# Patient Record
Sex: Female | Born: 1975 | Race: Black or African American | Hispanic: No | Marital: Single | Smoking: Former smoker
Health system: Southern US, Community
[De-identification: ages and names within clinical notes are randomized; demographics above are authoritative.]

## PROBLEM LIST (undated history)

## (undated) DIAGNOSIS — Z789 Other specified health status: Secondary | ICD-10-CM

## (undated) HISTORY — PX: TUBAL LIGATION: SHX77

## (undated) HISTORY — PX: CHOLECYSTECTOMY: SHX55

---

## 2010-02-09 ENCOUNTER — Emergency Department: Payer: Self-pay | Admitting: Emergency Medicine

## 2011-09-17 ENCOUNTER — Inpatient Hospital Stay (INDEPENDENT_AMBULATORY_CARE_PROVIDER_SITE_OTHER)
Admission: RE | Admit: 2011-09-17 | Discharge: 2011-09-17 | Disposition: A | Discharge status: Home / Self Care | Payer: Medicaid Other | Source: Ambulatory Visit | Attending: Family Medicine | Admitting: Family Medicine

## 2011-09-17 DIAGNOSIS — B86 Scabies: Secondary | ICD-10-CM

## 2012-07-15 DIAGNOSIS — R079 Chest pain, unspecified: Secondary | ICD-10-CM

## 2015-03-31 ENCOUNTER — Emergency Department (HOSPITAL_COMMUNITY)
Admission: EM | Admit: 2015-03-31 | Discharge: 2015-03-31 | Disposition: A | Discharge status: Home / Self Care | Payer: Medicaid Other | Attending: Emergency Medicine | Admitting: Emergency Medicine

## 2015-03-31 ENCOUNTER — Encounter (HOSPITAL_COMMUNITY): Payer: Self-pay | Admitting: Emergency Medicine

## 2015-03-31 DIAGNOSIS — N63 Unspecified lump in unspecified breast: Secondary | ICD-10-CM

## 2015-03-31 DIAGNOSIS — Z720 Tobacco use: Secondary | ICD-10-CM | POA: Insufficient documentation

## 2015-03-31 NOTE — Discharge Instructions (Signed)
Call the breast center tomorrow to see about getting an appointment for your breast lump to be evaluated and managed. Call [facility] for an appointment for ongoing care. Return to the ER for changes or worsening symptoms.   Breast Self-Awareness Breast self-awareness allows you to notice a breast problem early while it is still small. Do a breast self-exam:  Every month, 5-7 days after your period (menstrual period).  At the same time each month if you do not have periods anymore. Look for any:  Difference between your breasts (size, shape, or position).  Change in breast shape or size.  Fluid or blood coming from your nipples.  Changes in your nipples (dimpling, nipple movement).   Change in skin color or texture (redness, scaly areas). Feel for:  Lumps.  Bumps.  Dips.  Any other changes. HOW TO DO A BREAST SELF-EXAM Look at your breasts and nipples.  Take off all your clothes above your waist.  Stand in front of a mirror in a room with good lighting.  Put your hands on your hips and push your hands downward. Feel your breasts.   Lie flat on your back or stand in the shower or tub. If you are in the shower or tub, have wet, soapy hands.  Place your right arm above your head.  Place your left hand in the right underarm area.  Make small circles using the pads (not the fingertips) of your 3 middle fingers. Press lightly and then with medium and firm pressure.  Move your fingers a little lower and make the small circles at the 3 pressures (light, medium, and firm).  Continue moving your fingers lower and making circles until you reach the bottom of your breast.  Move your fingers one finger-width towards the center of the body.  Continue making the circles, this time moving upward until you reach the bottom of your neck.  Move your fingers one finger-width towards the center of your body.  Make circles downward when starting at the bottom of the  neck. Make circles upward when starting at the bottom of the breast. Stop when you reach the middle of the chest.   Repeat these steps on the other breast. Write down what looks and feels normal for each breast. Also write down any changes you notice. GET HELP RIGHT AWAY IF:  You see any changes in your breasts or nipples.  You see skin changes.  You have unusual discharge from your nipples.  You feel a new lump.  You feel unusually thick areas. Document Released: 05/07/2008 Document Revised: 11/05/2012 Document Reviewed: 03/05/2012 [facility] Patient Information 2015 [place], [place]. This information is not intended to replace advice given to you by your health care provider. Make sure you discuss any questions you have with your health care provider.  Fibrocystic Breast Changes Fibrocystic breast changes occur when breast ducts become blocked, causing painful, fluid-filled lumps (cysts) to form in the breast. This is a common condition that is noncancerous (benign). It occurs when women go through hormonal changes during their menstrual cycle. Fibrocystic breast changes can affect one or both breasts. CAUSES  The exact cause of fibrocystic breast changes is not known, but it may be related to the female hormones estrogen and progesterone. Family traits that get passed from parent to child (genetics) may also be a factor in some cases. SIGNS AND SYMPTOMS   Tenderness, mild discomfort, or pain.   Swelling.   Ropelike feeling when touching the breast.  Lumpy breast, one or both sides.   Changes in breast size, especially before (larger) and after (smaller) the menstrual period.   Green or dark brown nipple discharge (not blood).  Symptoms are usually worse before menstrual periods start and get better toward the end of the menstrual period.  DIAGNOSIS  To make a diagnosis, your health care provider will ask you questions and perform a physical exam of your breasts. The health  care provider may recommend other tests that can examine inside your breasts, such as:  A breast X-ray (mammogram).   Ultrasonography.  An MRI.  If something more than fibrocystic breast changes is suspected, your health care provider may take a breast tissue sample (breast biopsy) to examine. TREATMENT  Often, treatment is not needed. Your health care provider may recommend over-the-counter pain relievers to help lessen pain or discomfort caused by the fibrocystic breast changes. You may also be asked to change your diet to limit or stop eating foods or drinking beverages that contain caffeine. Foods and beverages that contain caffeine include chocolate, soda, coffee, and tea. Reducing sugar and fat in your diet may also help. Your health care provider may also recommend:  Fine needle aspiration to remove fluid from a cyst that is causing pain.   Surgery to remove a large, persistent, and tender cyst. HOME CARE INSTRUCTIONS   Examine your breasts after every menstrual period. If you do not have menstrual periods, check your breasts the first day of every month. Feel for changes, such as more tenderness, a new growth, a change in breast size, or a change in a lump that has always been there.   Only take over-the-counter or prescription medicine as directed by your health care provider.   Wear a well-fitted support or sports bra, especially when exercising.   Decrease or avoid caffeine, fat, and sugar in your diet as directed by your health care provider.  SEEK MEDICAL CARE IF:   You have fluid leaking (discharge) from your nipples, especially bloody discharge.   You have new lumps or bumps in the breast.   Your breast or breasts become enlarged, red, and painful.   You have areas of your breast that pucker in.   Your nipples appear flat or indented.  Document Released: 09/05/2006 Document Revised: 11/24/2013 Document Reviewed: 05/10/2013 [facility] Patient Information  2015 [place]. This information is not intended to replace advice given to you by your health care provider. Make sure you discuss any questions you have with your health care provider.  Breast Cyst A breast cyst is a sac in the breast that is filled with fluid. Breast cysts are common in women. Women can have one or many cysts. When the breasts contain many cysts, it is usually due to a noncancerous (benign) condition called fibrocystic change. These lumps form under the influence of female hormones (estrogen and progesterone). The lumps are most often located in the upper, outer portion of the breast. They are often more swollen, painful, and tender before your period starts. They usually disappear after menopause, unless you are on hormone therapy.  There are several types of cysts:  Macrocyst. This is a cyst that is about 2 in. (5.1 cm) in diameter.   Microcyst. This is a tiny cyst that you cannot feel but can be seen with a mammogram or an ultrasound.   Galactocele. This is a cyst containing milk that may develop if you suddenly stop breastfeeding.   Sebaceous cyst of the skin. This type  of cyst is not in the breast tissue itself. Breast cysts do not increase your risk of breast cancer. However, they must be monitored closely because they can be cancerous.  CAUSES  It is not known exactly what causes a breast cyst to form. Possible causes include:  An overgrowth of milk glands and connective tissue in the breast can block the milk glands, causing them to fill with fluid.   Scar tissue in the breast from previous surgery may block the glands, causing a cyst.  RISK FACTORS Estrogen may influence the development of a breast cyst.  SIGNS AND SYMPTOMS   Feeling a smooth, round, soft lump (like a grape) in the breast that is easily moveable.   Breast discomfort or pain.  Increase in size of the lump before your menstrual period and decrease in its size after your menstrual  period.  DIAGNOSIS  A cyst can be felt during a physical exam by your health care provider. A breast X-ray exam (mammogram) and ultrasonography will be done to confirm the diagnosis. Fluid may be removed from the cyst with a needle (fine needle aspiration) to make sure the cyst is not cancerous.  TREATMENT  Treatment may not be necessary. Your health care provider may monitor the cyst to see if it goes away on its own. If treatment is needed, it may include:  Hormone treatment.   Needle aspiration. There is a chance of the cyst coming back after aspiration.   Surgery to remove the whole cyst.  HOME CARE INSTRUCTIONS   Keep all follow-up appointments with your health care provider.  See your health care provider regularly:  Get a yearly exam by your health care provider.  Have a clinical breast exam by a health care provider every 1-3 years if you are 50-78 years of age. After age 53 years, you should have the exam every year.   Get mammogram tests as directed by your health care provider.   Understand the normal appearance and feel of your breasts and perform breast self-exams.   Only take over-the-counter or prescription medicines as directed by your health care provider.   Wear a supportive bra, especially when exercising.   Avoid caffeine.   Reduce your salt intake, especially before your menstrual period. Too much salt can cause fluid retention, breast swelling, and discomfort.  SEEK MEDICAL CARE IF:   You feel, or think you feel, a lump in your breast.   You notice that both breasts look or feel different than usual.   Your breast is still causing pain after your menstrual period is over.   You need medicine for breast pain and swelling that occurs with your menstrual period.  SEEK IMMEDIATE MEDICAL CARE IF:   You have severe pain, tenderness, redness, or warmth in your breast.   You have nipple discharge or bleeding.   Your breast lump becomes  hard and painful.   You find new lumps or bumps that were not there before.   You feel lumps in your armpit (axilla).   You notice dimpling or wrinkling of the breast or nipple.   You have a fever.  MAKE SURE YOU:  Understand these instructions.  Will watch your condition.  Will get help right away if you are not doing well or get worse. Document Released: 11/19/2005 Document Revised: 07/22/2013 Document Reviewed: 06/18/2013 [facility] Patient Information 2015 [place], [place]. This information is not intended to replace advice given to you by your health care provider. Make sure you  discuss any questions you have with your health care provider.  Breast Scan Breast scan is procedure done to examine dense breast tissue, which is difficult in a normal mammogram. It is used in women with breast lesions from fibrocystic disease, fibroadenoma, and fat necrosis. It also is used to determine the course of treatment for breast cancer. LET [facility] CARE PROVIDER KNOW ABOUT:  Any allergies you have.  All medicines you are taking, including vitamins, herbs, eye drops, creams, and over-the-counter medicines.  Previous problems you or members of your family have had with the use of anesthetics.  Any blood disorders you have.  Previous surgeries you have had.  Medical conditions you have.  Pregnancy or the possibility that you may be pregnant. RISKS AND COMPLICATIONS Generally, this is a safe procedure. However, as with any procedure, complications can occur. Possible complications include:   Slight discomfort from injection of radioactive substance.  Allergic reaction to contrast or radioactive substance used in exam. BEFORE THE PROCEDURE No fasting or sedation is required. PROCEDURE   You will be asked to remove all jewelry and clothing from the waist up.  An IV tube will be inserted in your arm or hand opposite the side of the breast to be examined. If both breasts are being  evaluated, the IV tube may be inserted into a vein in the foot.  You will be positioned face down on a table. The breast to be imaged will be placed through an opening in the table.  The radioactive agent will be injected into the IV tube. You may experience a slight metallic taste after the injection.  Imaging will begin a few minutes after the injection. A scanner will be placed over the breast and will record the radiation given off.  You may also be asked to get into different positions during the scan.  When the scan is complete, the IV tube is removed. AFTER THE PROCEDURE  You will be asked to get up slowly from the scanner to avoid light-headedness from lying flat during the procedure.  Drink plenty of fluids to help flush the remaining radioactive agent from your body. Document Released: 12/14/2004 Document Revised: 11/24/2013 Document Reviewed: 07/27/2013 [facility] Patient Information 2015 [place], [place]. This information is not intended to replace advice given to you by your health care provider. Make sure you discuss any questions you have with your health care provider.

## 2015-03-31 NOTE — ED Notes (Signed)
Pt c/o lump in left breast noticed today

## 2015-03-31 NOTE — ED Provider Notes (Signed)
CSN: [id number]     Arrival date & time 03/31/15  1811 History  This chart was scribed for non-physician practitioner working with Blake DivineJohn Wofford, MD by Richarda Overlieichard Holland, ED Scribe. This patient was seen in room TR06C/TR06C and the patient's care was started at 7:02 PM.    Chief Complaint  Patient presents with  . Breast Mass   The history is provided by the patient. No language interpreter was used.   HPI Comments: Lori Mejia is a 39 y.o. female with no significant medical history who presents to the Emergency Department complaining of lump in her left upper breast that she noticed today. She denies any rash on her breast. She denies any color change or discharge from her left nipple or to the skin. Pt denies any changes to the outside portion of her left breast. Pt reports no family hx of breast CA or lumps. She states that she had a breast lump when she was pregnant with her son but reports no other episodes. No nipple inversion or breast retraction. She says that she is working on getting Medicaid currently. Pt denies any history of DM, IVDU, or CA. Pt denies use of anticoagulation medication. She denies CP, SOB, abdominal pain, nausea, vomiting, fevers, chills, numbness or weakness, or any other symptoms.   History reviewed. No pertinent past medical history. History reviewed. No pertinent past surgical history. History reviewed. No pertinent family history. History  Substance Use Topics  . Smoking status: Current Every Day Smoker  . Smokeless tobacco: Not on file  . Alcohol Use: No   OB History    No data available     Review of Systems  Constitutional: Negative for fever and chills.  Respiratory: Negative for shortness of breath.   Cardiovascular: Negative for chest pain.  Gastrointestinal: Negative for nausea, vomiting and abdominal pain.  Musculoskeletal: Negative for myalgias and arthralgias.  Skin: Negative for color change and rash.       +Breast lump  No nipple discharge   Allergic/Immunologic: Negative for immunocompromised state.  Neurological: Negative for weakness and numbness.  Hematological: Does not bruise/bleed easily.   10 Systems reviewed and all are negative for acute change except as noted in the HPI.  Allergies  Review of patient's allergies indicates no known allergies.  Home Medications   Prior to Admission medications   Not on File   BP 155/101 mmHg  Pulse 76  Temp(Src) 98.8 F (37.1 C) (Oral)  Resp 18  SpO2 100%   Physical Exam  Constitutional: She is oriented to person, place, and time. Vital signs are normal. She appears well-developed and well-nourished.  Non-toxic appearance. No distress.  Afebrile, nontoxic, NAD  HENT:  Head: Normocephalic and atraumatic.  Mouth/Throat: Mucous membranes are normal.  Eyes: Conjunctivae and EOM are normal. Right eye exhibits no discharge. Left eye exhibits no discharge.  Neck: Normal range of motion. Neck supple.  Cardiovascular: Normal rate.   Pulmonary/Chest: Effort normal. No respiratory distress. Left breast exhibits mass and tenderness. Left breast exhibits no inverted nipple, no nipple discharge and no skin change. Breasts are symmetrical.    Irregularly shaped ~4cm breast mass to superior medial quadrant of L breast, mildly TTP, without overlying skin changes, no nipple changes or discharge. Breasts symmetric. R breast without masses.  Abdominal: Normal appearance. She exhibits no distension.  Musculoskeletal: Normal range of motion.  Lymphadenopathy:    She has no axillary adenopathy.  No axillary LAD  Neurological: She is alert and oriented to person,  place, and time. She has normal strength. No sensory deficit.  Skin: Skin is warm, dry and intact. No rash noted.  No skin erythema or warmth  Psychiatric: She has a normal mood and affect. Her behavior is normal.  Nursing note and vitals reviewed.   ED Course  Procedures   DIAGNOSTIC STUDIES: Oxygen Saturation is 100% on RA,  normal by my interpretation.    COORDINATION OF CARE: 7:09 PM Discussed treatment plan with pt at bedside and pt agreed to plan.   Labs Review Labs Reviewed - No data to display  Imaging Review No results found.   EKG Interpretation None      MDM   Final diagnoses:  Breast mass in female    39 y.o. female here for breast mass, irregular borders, no nipple changes, no skin changes. Discussed that it could be benign like fibrocystic breast disease, vs breast CA. Will have her f[facility] with [facility] and ultimately the breast center for mammogram or ultrasound. I explained the diagnosis and have given explicit precautions to return to the ER including for any other new or worsening symptoms. The patient understands and accepts the medical plan as it's been dictated and I have answered their questions. Discharge instructions concerning home care and prescriptions have been given. The patient is STABLE and is discharged to home in good condition.   I personally performed the services described in this documentation, which was scribed in my presence. The recorded information has been reviewed and is accurate.  BP 155/101 mmHg  Pulse 76  Temp(Src) 98.8 F (37.1 C) (Oral)  Resp 18  SpO2 100%     Izola Teague Camprubi-Soms, PA-C 03/31/15 1925  Blake Divine, MD 04/02/15 1035

## 2016-08-06 ENCOUNTER — Ambulatory Visit (HOSPITAL_COMMUNITY)
Admission: EM | Admit: 2016-08-06 | Discharge: 2016-08-06 | Disposition: A | Discharge status: Home / Self Care | Payer: Medicaid Other | Attending: Family Medicine | Admitting: Family Medicine

## 2016-08-06 ENCOUNTER — Encounter (HOSPITAL_COMMUNITY): Payer: Self-pay | Admitting: *Deleted

## 2016-08-06 DIAGNOSIS — S39012A Strain of muscle, fascia and tendon of lower back, initial encounter: Secondary | ICD-10-CM

## 2016-08-06 MED ORDER — CYCLOBENZAPRINE HCL 5 MG PO TABS: 5.0000 mg | ORAL_TABLET | Freq: Three times a day (TID) | ORAL | 0 refills | Status: DC

## 2016-08-06 MED ORDER — KETOROLAC TROMETHAMINE 30 MG/ML IJ SOLN
30.0000 mg | Freq: Once | INTRAMUSCULAR | Status: AC
  Administered 2016-08-06: 30 mg via INTRAMUSCULAR

## 2016-08-06 MED ORDER — DICLOFENAC POTASSIUM 50 MG PO TABS: 50.0000 mg | ORAL_TABLET | Freq: Three times a day (TID) | ORAL | 0 refills | Status: DC

## 2016-08-06 MED ORDER — KETOROLAC TROMETHAMINE 30 MG/ML IJ SOLN
INTRAMUSCULAR | Status: AC
  Filled 2016-08-06: qty 1

## 2016-08-06 NOTE — ED Provider Notes (Signed)
[facility]    CSN: [id number] Arrival date & time: 08/06/16  1803  First Provider Contact:  First MD Initiated Contact with Patient 08/06/16 1913        History   Chief Complaint Chief Complaint  Patient presents with  . Back Pain    HPI Lori Mejia is a 40 y.o. female.    Back Pain  Location:  Gluteal region Quality:  Shooting Radiates to:  Does not radiate Pain severity:  Moderate Onset quality:  Sudden Duration:  12 hours Progression:  Unchanged Chronicity:  New Context: twisting   Relieved by:  None tried Worsened by:  Movement, bending, sitting, palpation and twisting Associated symptoms: no abdominal pain, no bladder incontinence, no dysuria, no fever, no leg pain, no numbness, no paresthesias and no weakness   Risk factors: lack of exercise and obesity     History reviewed. No pertinent past medical history.  There are no active problems to display for this patient.   History reviewed. No pertinent surgical history.  OB History    No data available       Home Medications    Prior to Admission medications   Not on File    Family History No family history on file.  Social History Social History  Substance Use Topics  . Smoking status: Current Every Day Smoker  . Smokeless tobacco: Not on file  . Alcohol use No     Allergies   Review of patient's allergies indicates no known allergies.   Review of Systems Review of Systems  Constitutional: Negative.  Negative for fever.  Gastrointestinal: Negative.  Negative for abdominal pain.  Genitourinary: Negative.  Negative for bladder incontinence and dysuria.  Musculoskeletal: Positive for back pain and gait problem.  Skin: Negative.   Neurological: Negative for weakness, numbness and paresthesias.  All other systems reviewed and are negative.    Physical Exam Triage Vital Signs ED Triage Vitals [08/06/16 1845]  Enc Vitals Group     BP 147/90     Pulse Rate 77     Resp  16     Temp 99.6 F (37.6 C)     Temp Source Oral     SpO2 100 %     Weight      Height      Head Circumference      Peak Flow      Pain Score      Pain Loc      Pain Edu?      Excl. in GC?    No data found.   Updated Vital Signs BP 147/90 (BP Location: Left Arm)   Pulse 77   Temp 99.6 F (37.6 C) (Oral)   Resp 16   SpO2 100%   Visual Acuity Right Eye Distance:   Left Eye Distance:   Bilateral Distance:    Right Eye Near:   Left Eye Near:    Bilateral Near:     Physical Exam  Constitutional: She is oriented to person, place, and time. She appears well-developed and well-nourished. She appears distressed.  Pulmonary/Chest: Effort normal and breath sounds normal.  Abdominal: Soft. Bowel sounds are normal.  Musculoskeletal: She exhibits tenderness.  Neurological: She is alert and oriented to person, place, and time. She has normal reflexes.  Neg slr sitting, nl ehl sens and strength.  Skin: Skin is warm and dry.  Nursing note and vitals reviewed.    UC Treatments / Results  Labs (all labs ordered  are listed, but only abnormal results are displayed) Labs Reviewed - No data to display  EKG  EKG Interpretation None       Radiology No results found.  Procedures Procedures (including critical care time)  Medications Ordered in UC Medications  ketorolac (TORADOL) 30 MG/ML injection 30 mg (not administered)     Initial Impression / Assessment and Plan / UC Course  I have reviewed the triage vital signs and the nursing notes.  Pertinent labs & imaging results that were available during my care of the patient were reviewed by me and considered in my medical decision making (see chart for details).  Clinical Course      Final Clinical Impressions(s) / UC Diagnoses   Final diagnoses:  None    New Prescriptions New Prescriptions   No medications on file     Linna HoffJames D Kalob Bergen, MD 08/06/16 1929

## 2016-08-06 NOTE — ED Triage Notes (Signed)
C/O sudden onset low back pain radiating into bilat buttocks  While crawling out of bed.  Denies parasthesias.

## 2017-08-21 ENCOUNTER — Emergency Department (HOSPITAL_COMMUNITY)
Admission: EM | Admit: 2017-08-21 | Discharge: 2017-08-21 | Disposition: A | Discharge status: Home / Self Care | Payer: Worker's Compensation | Attending: Emergency Medicine | Admitting: Emergency Medicine

## 2017-08-21 ENCOUNTER — Encounter (HOSPITAL_COMMUNITY): Payer: Self-pay | Admitting: *Deleted

## 2017-08-21 ENCOUNTER — Emergency Department (HOSPITAL_COMMUNITY): Payer: Worker's Compensation

## 2017-08-21 DIAGNOSIS — M62830 Muscle spasm of back: Secondary | ICD-10-CM | POA: Diagnosis not present

## 2017-08-21 DIAGNOSIS — S3992XA Unspecified injury of lower back, initial encounter: Secondary | ICD-10-CM | POA: Diagnosis present

## 2017-08-21 DIAGNOSIS — F17210 Nicotine dependence, cigarettes, uncomplicated: Secondary | ICD-10-CM | POA: Diagnosis not present

## 2017-08-21 DIAGNOSIS — X503XXA Overexertion from repetitive movements, initial encounter: Secondary | ICD-10-CM | POA: Insufficient documentation

## 2017-08-21 DIAGNOSIS — S39012A Strain of muscle, fascia and tendon of lower back, initial encounter: Secondary | ICD-10-CM | POA: Insufficient documentation

## 2017-08-21 DIAGNOSIS — Y9389 Activity, other specified: Secondary | ICD-10-CM | POA: Diagnosis not present

## 2017-08-21 DIAGNOSIS — Y999 Unspecified external cause status: Secondary | ICD-10-CM | POA: Diagnosis not present

## 2017-08-21 DIAGNOSIS — Y929 Unspecified place or not applicable: Secondary | ICD-10-CM | POA: Diagnosis not present

## 2017-08-21 MED ORDER — IBUPROFEN 800 MG PO TABS
800.0000 mg | ORAL_TABLET | Freq: Once | ORAL | Status: AC
  Administered 2017-08-21: 800 mg via ORAL
  Filled 2017-08-21: qty 1

## 2017-08-21 MED ORDER — IBUPROFEN 800 MG PO TABS: 800.0000 mg | ORAL_TABLET | Freq: Three times a day (TID) | ORAL | 0 refills | Status: DC

## 2017-08-21 MED ORDER — CYCLOBENZAPRINE HCL 10 MG PO TABS: 5.0000 mg | ORAL_TABLET | Freq: Two times a day (BID) | ORAL | 0 refills | Status: DC | PRN

## 2017-08-21 MED ORDER — MORPHINE SULFATE (PF) 4 MG/ML IV SOLN
4.0000 mg | Freq: Once | INTRAVENOUS | Status: AC
  Administered 2017-08-21: 4 mg via INTRAMUSCULAR
  Filled 2017-08-21: qty 1

## 2017-08-21 NOTE — ED Notes (Signed)
Pt to xray

## 2017-08-21 NOTE — ED Triage Notes (Signed)
Pt in c/o R lower back pain onset yesterday after lifting heavy boxes a work per pt, pt ambulatory, denies bowel &  Bladder incontinence, A&O x4

## 2017-08-21 NOTE — ED Provider Notes (Signed)
[facility] Provider Note   CSN: [id number] Arrival date & time: 08/21/17  1019     History   Chief Complaint Chief Complaint  Patient presents with  . Back Pain    HPI Lori Mejia is a 41 y.o. female.  HPI   Patient to the Er with PMH of back pain that happened yesterday morning when picking up a box at her [employer] job. They wanted her to see an urgent care provider but she was never seen. She has been having pain to her right hip and low lumbar that radiates down towards her knee. No weakness. The pain is shooting and she cannot find a comfortable position to sit in. She is tearful. She did not fall. She has not had any weight loss, fevers, chills bowel or urine incontinence. She did not hear a pop and has not had any numbness.  History reviewed. No pertinent past medical history.  There are no active problems to display for this patient.   Past Surgical History:  Procedure Laterality Date  . CHOLECYSTECTOMY    . TUBAL LIGATION      OB History    No data available       Home Medications    Prior to Admission medications   Medication Sig Start Date End Date Taking? Authorizing Provider  cyclobenzaprine (FLEXERIL) 10 MG tablet Take 0.5-1 tablets (5-10 mg total) by mouth 2 (two) times daily as needed. 08/21/17   Marlon Pel, PA-C  diclofenac (CATAFLAM) 50 MG tablet Take 1 tablet (50 mg total) by mouth 3 (three) times daily. 08/06/16   Linna Hoff, MD  ibuprofen (ADVIL,MOTRIN) 800 MG tablet Take 1 tablet (800 mg total) by mouth 3 (three) times daily. 08/21/17   Marlon Pel, PA-C    Family History No family history on file.  Social History Social History  Substance Use Topics  . Smoking status: Current Every Day Smoker    Packs/day: 0.50    Types: Cigarettes  . Smokeless tobacco: Never Used  . Alcohol use No     Allergies   Patient has no known allergies.   Review of Systems Review of Systems   The patient denies anorexia, fever, weight  loss, vision loss, decreased hearing, hoarseness, chest pain, syncope, dyspnea on exertion, peripheral edema, balance deficits, hemoptysis, abdominal pain, melena, hematochezia, severe indigestion/heartburn, hematuria, incontinence, genital sores, muscle weakness, suspicious skin lesions, transient blindness, depression, unusual weight change, abnormal bleeding, enlarged lymph nodes, angioedema, and breast masses.   Physical Exam Updated Vital Signs BP (!) 163/93 (BP Location: Left Arm)   Pulse 79   Temp 98.2 F (36.8 C) (Oral)   Resp 16   Ht 5' 3.5" (1.613 m)   Wt 74.8 kg (165 lb)   LMP 08/18/2017 (Approximate)   SpO2 100%   BMI 28.77 kg/m   Physical Exam  Constitutional: She appears well-developed and well-nourished. No distress.  HENT:  Head: Normocephalic and atraumatic.  Eyes: Pupils are equal, round, and reactive to light.  Neck: Normal range of motion. Neck supple.  Cardiovascular: Normal rate and regular rhythm.   Pulmonary/Chest: Effort normal.  Abdominal: Soft.  Musculoskeletal:       Right hip: She exhibits normal range of motion, normal strength, no tenderness, no bony tenderness, no swelling, no crepitus, no deformity and no laceration.       Lumbar back: She exhibits tenderness and pain. She exhibits normal range of motion, no bony tenderness, no swelling, no edema, no deformity, no laceration,  no spasm and normal pulse.  Neurological: She is alert.  Skin: Skin is warm and dry.  Nursing note and vitals reviewed.    ED Treatments / Results  Labs (all labs ordered are listed, but only abnormal results are displayed) Labs Reviewed - No data to display  EKG  EKG Interpretation None       Radiology Dg Lumbar Spine Complete  Result Date: 08/21/2017 CLINICAL DATA:  Low back injury. Right hip and leg pain after lifting heavy boxes at work. Initial encounter. EXAM: LUMBAR SPINE - COMPLETE 4+ VIEW COMPARISON:  None. FINDINGS: There is no evidence of lumbar spine  fracture. Alignment is normal. Intervertebral disc spaces are maintained. Cholecystectomy clips. 3 mm left flank stone compatible with nephrolithiasis. IMPRESSION: 1. Negative lumbar spine series. 2. 3 mm probable left renal calculus. Electronically Signed   By: Marnee Spring M.D.   On: 08/21/2017 13:53    Procedures Procedures (including critical care time)  Medications Ordered in ED Medications  morphine 4 MG/ML injection 4 mg (4 mg Intramuscular Given 08/21/17 1311)  ibuprofen (ADVIL,MOTRIN) tablet 800 mg (800 mg Oral Given 08/21/17 1311)     Initial Impression / Assessment and Plan / ED Course  I have reviewed the triage vital signs and the nursing notes.  Pertinent labs & imaging results that were available during my care of the patient were reviewed by me and considered in my medical decision making (see chart for details).     No red flags concerning patient's back pain. No s/s of central cord compression or cauda equina. Lower extremities are neurovascularly intact and patient is ambulating without difficulty. She was given a shot of Morphine and Ibuprofen for pain and she is now not having any pain. She had a lumbar xray which is unremarkable. Will refer to Dr. Eulah Pont (ortho office) for follow-up.  No red flag s/s of low back pain. Patient was counseled on back pain precautions and told to do activity as tolerated but do not lift, push, or pull heavy objects more than 10 pounds for the next week. Patient counseled to use ice or heat on back for no longer than 15 minutes every hour.   Patient prescribed muscle relaxer and counseled on proper use of muscle relaxant medication. Told not to combine with current flexeril.   Urged patient not to drink alcohol, drive, or perform any other activities that requires focus while taking either of these medications.   Patient urged to follow-up with PCP if pain does not improve with treatment and rest or if pain becomes recurrent. Urged to  return with worsening severe pain, loss of bowel or bladder control, trouble walking.   The patient verbalizes understanding and agrees with the plan.    Final Clinical Impressions(s) / ED Diagnoses   Final diagnoses:  Muscle spasm of back  Strain of lumbar region, initial encounter    New Prescriptions New Prescriptions   CYCLOBENZAPRINE (FLEXERIL) 10 MG TABLET    Take 0.5-1 tablets (5-10 mg total) by mouth 2 (two) times daily as needed.   IBUPROFEN (ADVIL,MOTRIN) 800 MG TABLET    Take 1 tablet (800 mg total) by mouth 3 (three) times daily.     Marlon Pel, PA-C 08/21/17 1412    Mancel Bale, MD 08/21/17 [phone]

## 2017-08-23 ENCOUNTER — Encounter (HOSPITAL_COMMUNITY): Payer: Self-pay

## 2017-08-23 ENCOUNTER — Emergency Department (HOSPITAL_COMMUNITY)
Admission: EM | Admit: 2017-08-23 | Discharge: 2017-08-23 | Disposition: A | Discharge status: Home / Self Care | Payer: Medicaid Other | Attending: Emergency Medicine | Admitting: Emergency Medicine

## 2017-08-23 DIAGNOSIS — Z5321 Procedure and treatment not carried out due to patient leaving prior to being seen by health care provider: Secondary | ICD-10-CM | POA: Insufficient documentation

## 2017-08-23 DIAGNOSIS — M549 Dorsalgia, unspecified: Secondary | ICD-10-CM | POA: Insufficient documentation

## 2017-08-23 NOTE — ED Notes (Signed)
Patient called 2 more times for room placement without answer.

## 2017-08-23 NOTE — ED Notes (Signed)
Patient called for room placement without answer

## 2017-08-23 NOTE — ED Triage Notes (Signed)
Patient c/o right lower back pain that radiates down the right leg x 3 days. 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 Patient states she went to [facility] 3 days ago and was given Flexeril and Ibuprofen 800 mg. States the meds help the back pain, but not the pain that radiates down the right leg.

## 2019-01-04 ENCOUNTER — Encounter (HOSPITAL_COMMUNITY): Payer: Self-pay | Admitting: Emergency Medicine

## 2019-01-04 ENCOUNTER — Ambulatory Visit (HOSPITAL_COMMUNITY)
Admission: EM | Admit: 2019-01-04 | Discharge: 2019-01-04 | Disposition: A | Discharge status: Home / Self Care | Payer: Medicaid Other | Attending: Family Medicine | Admitting: Family Medicine

## 2019-01-04 DIAGNOSIS — M545 Low back pain, unspecified: Secondary | ICD-10-CM

## 2019-01-04 DIAGNOSIS — M542 Cervicalgia: Secondary | ICD-10-CM

## 2019-01-04 MED ORDER — IBUPROFEN 800 MG PO TABS: 800.0000 mg | ORAL_TABLET | Freq: Three times a day (TID) | ORAL | 0 refills | Status: DC

## 2019-01-04 MED ORDER — CYCLOBENZAPRINE HCL 10 MG PO TABS: 5.0000 mg | ORAL_TABLET | Freq: Three times a day (TID) | ORAL | 0 refills | Status: DC | PRN

## 2019-01-04 MED ORDER — KETOROLAC TROMETHAMINE 60 MG/2ML IM SOLN
INTRAMUSCULAR | Status: AC
  Filled 2019-01-04: qty 2

## 2019-01-04 MED ORDER — KETOROLAC TROMETHAMINE 60 MG/2ML IM SOLN
60.0000 mg | Freq: Once | INTRAMUSCULAR | Status: AC
  Administered 2019-01-04: 60 mg via INTRAMUSCULAR
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
## 2019-01-04 NOTE — Discharge Instructions (Addendum)
Get plenty of rest Activity as tolerated Take ibuprofen 3 times a day with food This is for pain and inflammation Take the cyclobenzaprine as needed muscle relaxer Caution drowsiness Expect improvement over next few days Return if you fail to improve, Or if you feel worse at any time

## 2019-05-23 IMAGING — CR DG LUMBAR SPINE COMPLETE 4+V
5 series · 5 of 5 positions shown · non-contrast
Comparison: None.

CLINICAL DATA: Low back injury. Right hip and leg pain after
lifting heavy boxes at work. Initial encounter.

EXAM:
LUMBAR SPINE - COMPLETE 4+ VIEW

[l-spine obl (1 of 2)]
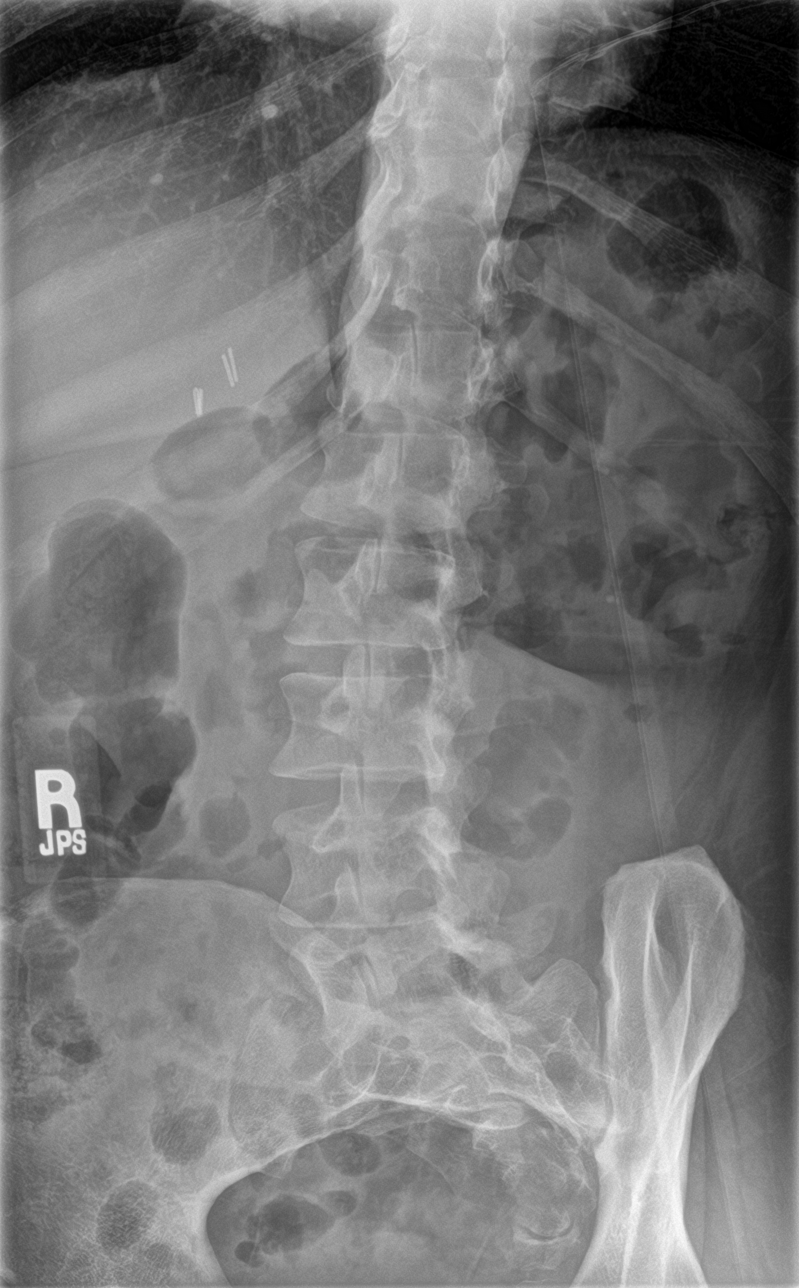

[l-spine lat]
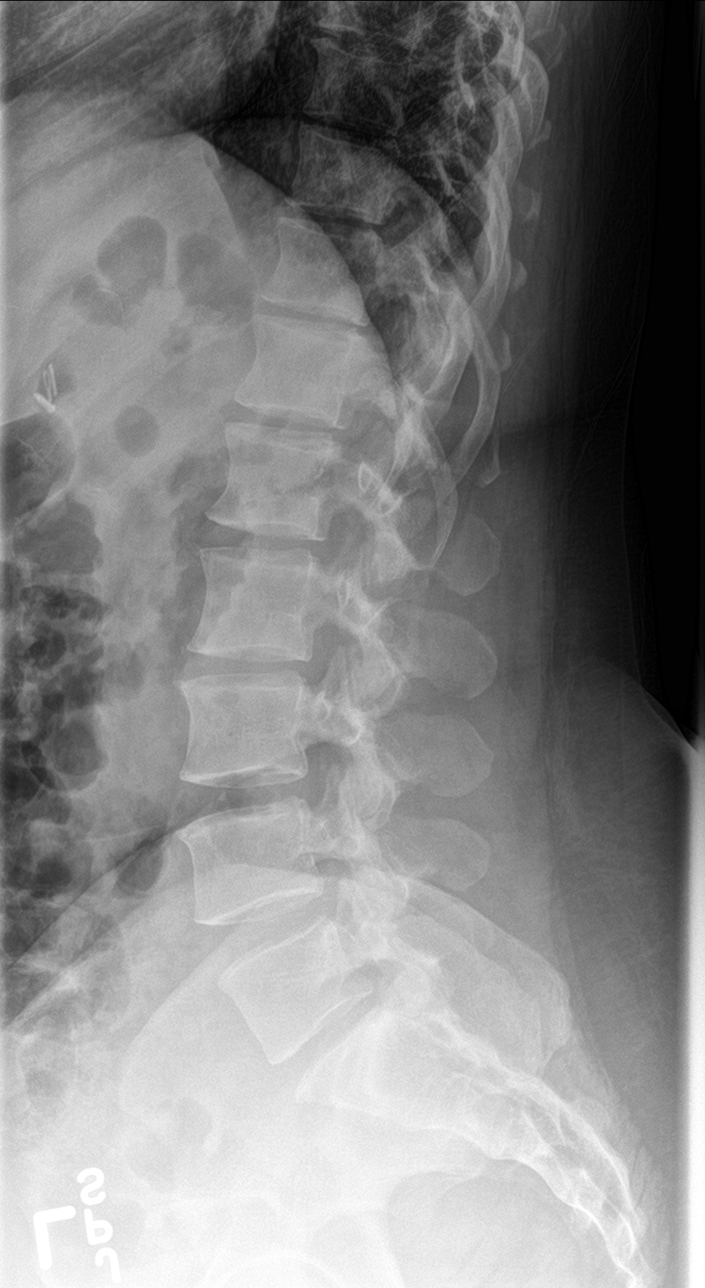

[l-spine spot]
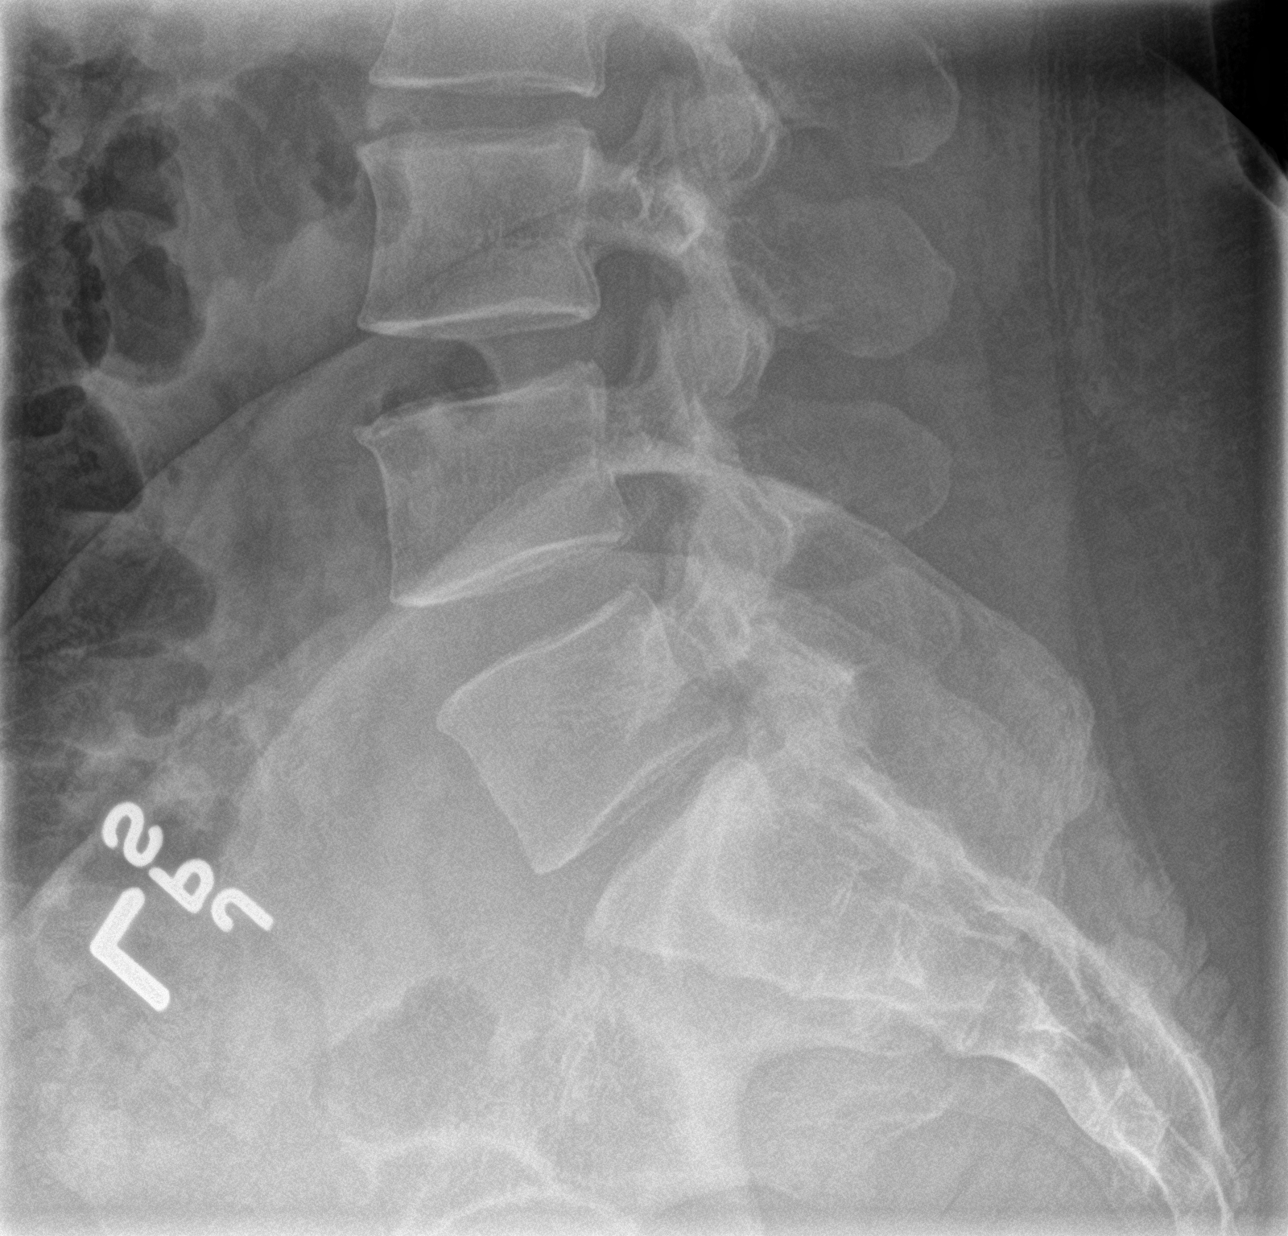

[l-spine ap]
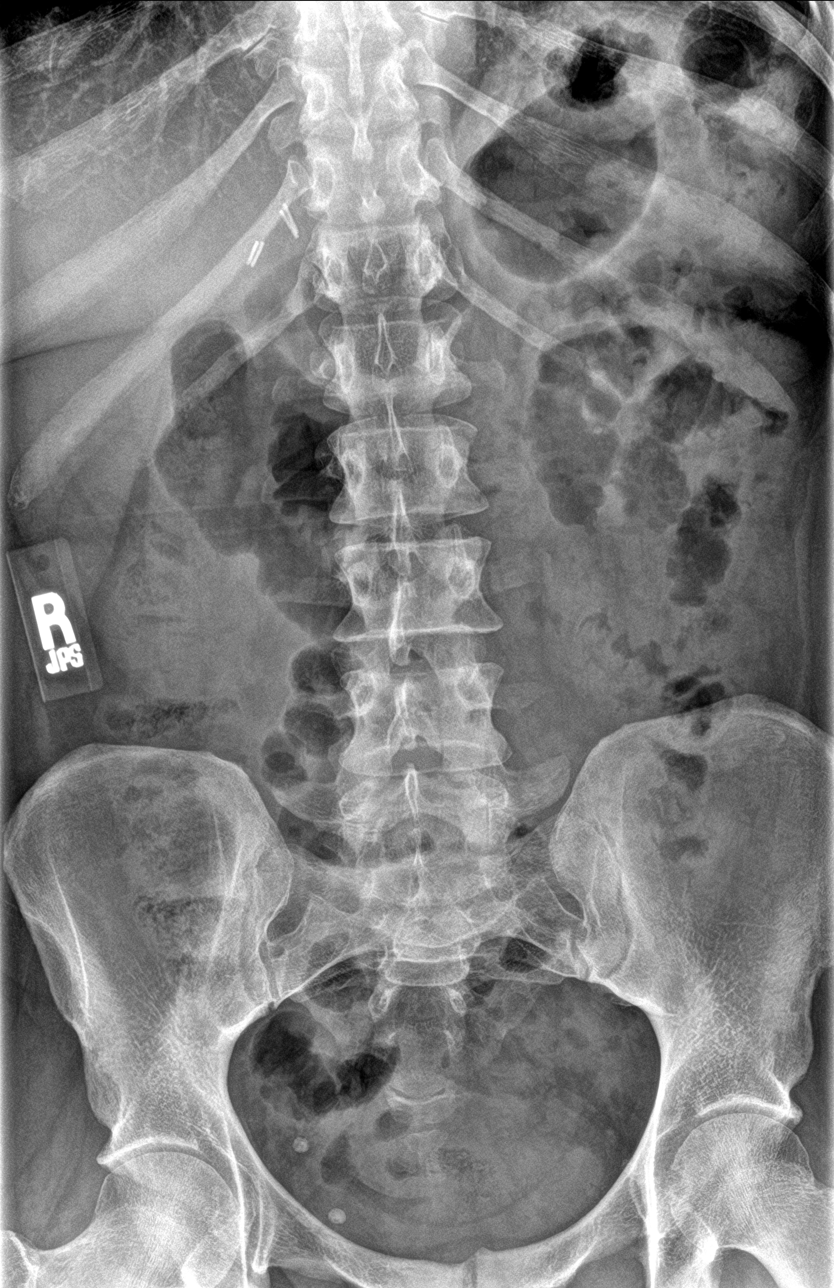

[l-spine obl (2 of 2)]
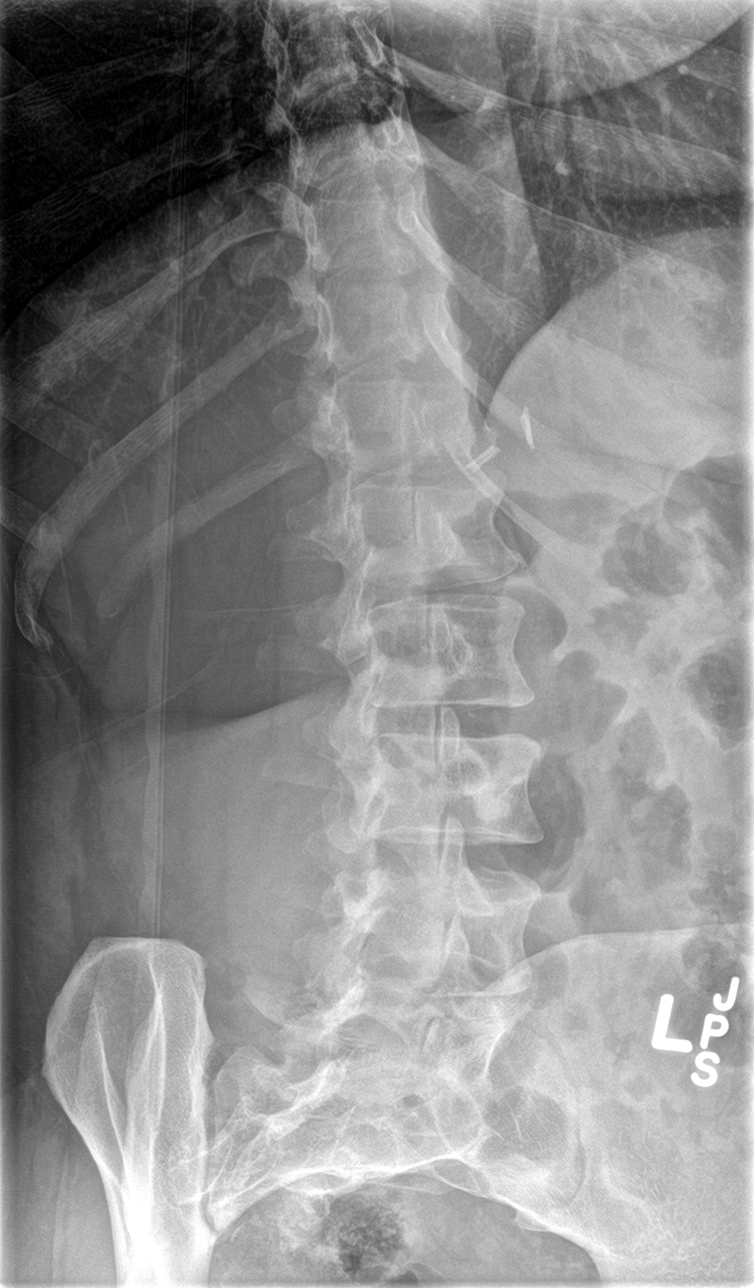

[5 of 5 positions shown; findings below may reference images not displayed]

FINDINGS: There is no evidence of lumbar spine fracture. Alignment is normal.
Intervertebral disc spaces are maintained. Cholecystectomy clips. 3
mm left flank stone compatible with nephrolithiasis.
IMPRESSION: 1. Negative lumbar spine series.
2. 3 mm probable left renal calculus.

## 2020-01-19 ENCOUNTER — Ambulatory Visit (HOSPITAL_COMMUNITY)
Admission: EM | Admit: 2020-01-19 | Discharge: 2020-01-19 | Disposition: A | Discharge status: Home / Self Care | Payer: Self-pay | Attending: Family Medicine | Admitting: Family Medicine

## 2020-01-19 ENCOUNTER — Other Ambulatory Visit: Payer: Self-pay

## 2020-01-19 DIAGNOSIS — M5441 Lumbago with sciatica, right side: Secondary | ICD-10-CM | POA: Insufficient documentation

## 2020-01-19 DIAGNOSIS — M5442 Lumbago with sciatica, left side: Secondary | ICD-10-CM

## 2020-01-19 LAB — POCT URINALYSIS DIP (DEVICE)
Glucose, UA: NEGATIVE mg/dL
Ketones, ur: NEGATIVE mg/dL
Nitrite: NEGATIVE
Protein, ur: NEGATIVE mg/dL
Specific Gravity, Urine: 1.025 (ref 1.005–1.030)
Urobilinogen, UA: 1 mg/dL (ref 0.0–1.0)
pH: 6 (ref 5.0–8.0)

## 2020-01-19 MED ORDER — CYCLOBENZAPRINE HCL 10 MG PO TABS: 5.0000 mg | ORAL_TABLET | Freq: Three times a day (TID) | ORAL | 0 refills | Status: DC | PRN

## 2020-01-19 MED ORDER — KETOROLAC TROMETHAMINE 30 MG/ML IJ SOLN
INTRAMUSCULAR | Status: AC
  Filled 2020-01-19: qty 1

## 2020-01-19 MED ORDER — PREDNISONE 10 MG (21) PO TBPK: ORAL_TABLET | ORAL | 0 refills | Status: DC

## 2020-01-19 MED ORDER — KETOROLAC TROMETHAMINE 30 MG/ML IJ SOLN
30.0000 mg | Freq: Once | INTRAMUSCULAR | Status: AC
  Administered 2020-01-19: 10:00:00 30 mg via INTRAMUSCULAR

## 2020-01-19 NOTE — Discharge Instructions (Signed)
Take the medication as prescribed Take the prednisone with food.  Flexeril as needed for muscle spasming Toradol given here for acute pain Rest, heat Follow up as needed for continued or worsening symptoms

## 2020-01-19 NOTE — ED Provider Notes (Signed)
[place]    CSN: [id number] Arrival date & time: 01/19/20  0859      History   Chief Complaint Chief Complaint  Patient presents with  . Back Pain    HPI Lori Mejia is a 44 y.o. female.   Patient is a 44 year old female with no significant past medical history.  She presents today with lower back pain and muscle spasming.  Pain has been constant over the past 4 days.  The pain radiates into bilateral buttocks and she describes the pain as sharp and stabbing at times.  She has been using Tylenol without much relief.  Denies any significant numbness but has had some mild tingling.  Denies any saddle paresthesias, loss of bowel or bladder function.  Denies any fever, dysuria, hematuria or urinary frequency.  No injuries to the back but she does heavy lifting at work working at [employer].  ROS per HPI      No past medical history on file.  There are no problems to display for this patient.   Past Surgical History:  Procedure Laterality Date  . CHOLECYSTECTOMY    . TUBAL LIGATION      OB History   No obstetric history on file.      Home Medications    Prior to Admission medications   Medication Sig Start Date End Date Taking? Authorizing Provider  cyclobenzaprine (FLEXERIL) 10 MG tablet Take 0.5-1 tablets (5-10 mg total) by mouth 3 (three) times daily as needed. 01/19/20   Loura Halt A, NP  predniSONE (STERAPRED UNI-PAK 21 TAB) 10 MG (21) TBPK tablet 6 tabs for 1 day, then 5 tabs for 1 das, then 4 tabs for 1 day, then 3 tabs for 1 day, 2 tabs for 1 day, then 1 tab for 1 day 01/19/20   Orvan July, NP    Family History No family history on file.  Social History Social History   Tobacco Use  . Smoking status: Current Every Day Smoker    Packs/day: 0.50    Types: Cigarettes  . Smokeless tobacco: Never Used  Substance Use Topics  . Alcohol use: No  . Drug use: No     Allergies   Patient has no known allergies.   Review of Systems Review of  Systems   Physical Exam Triage Vital Signs ED Triage Vitals [01/19/20 0928]  Enc Vitals Group     BP 132/87     Pulse Rate 83     Resp 16     Temp 98.3 F (36.8 C)     Temp Source Oral     SpO2 98 %     Weight 175 lb (79.4 kg)     Height      Head Circumference      Peak Flow      Pain Score 8     Pain Loc      Pain Edu?      Excl. in [place]?    No data found.  Updated Vital Signs BP 132/87 (BP Location: Right Arm)   Pulse 83   Temp 98.3 F (36.8 C) (Oral)   Resp 16   Wt 175 lb (79.4 kg)   LMP 12/31/2019   SpO2 98%   BMI 30.51 kg/m   Visual Acuity Right Eye Distance:   Left Eye Distance:   Bilateral Distance:    Right Eye Near:   Left Eye Near:    Bilateral Near:     Physical Exam Vitals  and nursing note reviewed.  Constitutional:      General: She is not in acute distress.    Appearance: Normal appearance. She is not ill-appearing, toxic-appearing or diaphoretic.  HENT:     Head: Normocephalic.     Nose: Nose normal.  Eyes:     Conjunctiva/sclera: Conjunctivae normal.  Pulmonary:     Effort: Pulmonary effort is normal.  Musculoskeletal:     Cervical back: Normal range of motion.     Lumbar back: Spasms and tenderness present. No bony tenderness. Decreased range of motion. No scoliosis.       Back:  Skin:    General: Skin is warm and dry.     Findings: No rash.  Neurological:     Mental Status: She is alert.  Psychiatric:        Mood and Affect: Mood normal.      UC Treatments / Results  Labs (all labs ordered are listed, but only abnormal results are displayed) Labs Reviewed  POCT URINALYSIS DIP (DEVICE) - Abnormal; Notable for the following components:      Result Value   Bilirubin Urine SMALL (*)    Hgb urine dipstick TRACE (*)    Leukocytes,Ua TRACE (*)    All other components within normal limits  URINE CULTURE    EKG   Radiology No results found.  Procedures Procedures (including critical care time)  Medications Ordered  in UC Medications  ketorolac (TORADOL) 30 MG/ML injection 30 mg (30 mg Intramuscular Given 01/19/20 1011)    Initial Impression / Assessment and Plan / UC Course  I have reviewed the triage vital signs and the nursing notes.  Pertinent labs & imaging results that were available during my care of the patient were reviewed by me and considered in my medical decision making (see chart for details).     Acute bilateral lower back pain with bilateral sciatica. No concerning red flags. Treating with prednisone taper and Flexeril for muscle spasming. Toradol given here for acute pain Recommended rest, heat to the area Recommended follow-up for any continued or worsening symptoms  Urine with trace leuks and trace blood will send for culture Final Clinical Impressions(s) / UC Diagnoses   Final diagnoses:  Acute bilateral low back pain with bilateral sciatica     Discharge Instructions     Take the medication as prescribed Take the prednisone with food.  Flexeril as needed for muscle spasming Toradol given here for acute pain Rest, heat Follow up as needed for continued or worsening symptoms     ED Prescriptions    Medication Sig Dispense Auth. Provider   predniSONE (STERAPRED UNI-PAK 21 TAB) 10 MG (21) TBPK tablet 6 tabs for 1 day, then 5 tabs for 1 das, then 4 tabs for 1 day, then 3 tabs for 1 day, 2 tabs for 1 day, then 1 tab for 1 day 21 tablet Shevonne Wolf A, NP   cyclobenzaprine (FLEXERIL) 10 MG tablet Take 0.5-1 tablets (5-10 mg total) by mouth 3 (three) times daily as needed. 30 tablet Dahlia Byes A, NP     PDMP not reviewed this encounter.   Dahlia Byes A, NP 01/19/20 1023

## 2020-01-19 NOTE — ED Triage Notes (Signed)
Pt states she has lower back pain and spasms. This has been going on sense last Friday.

## 2020-01-20 LAB — URINE CULTURE: Culture: >=100000 — AB

## 2022-05-21 ENCOUNTER — Ambulatory Visit (HOSPITAL_COMMUNITY)
Admission: EM | Admit: 2022-05-21 | Discharge: 2022-05-21 | Disposition: A | Discharge status: Home / Self Care | Payer: Self-pay | Attending: Emergency Medicine | Admitting: Emergency Medicine

## 2022-05-21 ENCOUNTER — Encounter (HOSPITAL_COMMUNITY): Payer: Self-pay | Admitting: Emergency Medicine

## 2022-05-21 DIAGNOSIS — M545 Low back pain, unspecified: Secondary | ICD-10-CM

## 2022-05-21 DIAGNOSIS — M546 Pain in thoracic spine: Secondary | ICD-10-CM

## 2022-05-21 MED ORDER — KETOROLAC TROMETHAMINE 30 MG/ML IJ SOLN
30.0000 mg | Freq: Once | INTRAMUSCULAR | Status: AC
  Administered 2022-05-21: 30 mg via INTRAMUSCULAR

## 2022-05-21 MED ORDER — KETOROLAC TROMETHAMINE 30 MG/ML IJ SOLN
INTRAMUSCULAR | Status: AC
  Filled 2022-05-21: qty 1

## 2022-05-21 MED ORDER — PREDNISONE 20 MG PO TABS: 40.0000 mg | ORAL_TABLET | Freq: Every day | ORAL | 0 refills | Status: DC

## 2022-05-21 MED ORDER — TIZANIDINE HCL 4 MG PO CAPS: 4.0000 mg | ORAL_CAPSULE | Freq: Three times a day (TID) | ORAL | 0 refills | Status: DC | PRN
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
## 2022-05-21 NOTE — Discharge Instructions (Signed)
Your pain is most likely caused by irritation to the muscles .  Starting tomorrow take prednisone every morning with food for the next 5 days  You may use muscle relaxer as needed for additional comfort every 8 hours, be mindful this has the potential to make you drowsy  You may use heating pad in 15 minute intervals as needed for additional comfort, or you may find comfort in using ice in 10-15 minutes over affected area  Begin stretching affected area daily for 10 minutes as tolerated to further loosen muscles   When lying down place pillow underneath and between knees for support  Can try sleeping without pillow on firm mattress   Practice good posture: head back, shoulders back, chest forward, pelvis back and weight distributed evenly on both legs  If pain persist after recommended treatment or reoccurs if may be beneficial to follow up with orthopedic specialist for evaluation, this doctor specializes in the bones and can manage your symptoms long-term with options such as but not limited to imaging, medications or physical therapy

## 2022-05-22 ENCOUNTER — Telehealth (HOSPITAL_COMMUNITY): Payer: Self-pay | Admitting: Emergency Medicine

## 2022-05-22 MED ORDER — TIZANIDINE HCL 4 MG PO CAPS: 4.0000 mg | ORAL_CAPSULE | Freq: Three times a day (TID) | ORAL | 0 refills | Status: DC

## 2022-05-22 MED ORDER — TIZANIDINE HCL 4 MG PO CAPS: 4.0000 mg | ORAL_CAPSULE | Freq: Three times a day (TID) | ORAL | 0 refills | Status: DC | PRN

## 2022-05-22 MED ORDER — PREDNISONE 20 MG PO TABS: 40.0000 mg | ORAL_TABLET | Freq: Every day | ORAL | 0 refills | Status: DC

## 2022-05-22 NOTE — Telephone Encounter (Signed)
Patient notified pharmacy has not received medications, resent

## 2022-12-12 ENCOUNTER — Encounter (HOSPITAL_COMMUNITY): Payer: Self-pay | Admitting: Emergency Medicine

## 2022-12-12 ENCOUNTER — Ambulatory Visit (INDEPENDENT_AMBULATORY_CARE_PROVIDER_SITE_OTHER): Payer: Medicaid Other

## 2022-12-12 ENCOUNTER — Ambulatory Visit (HOSPITAL_COMMUNITY)
Admission: EM | Admit: 2022-12-12 | Discharge: 2022-12-12 | Disposition: A | Discharge status: Home / Self Care | Payer: Medicaid Other | Attending: Family Medicine | Admitting: Family Medicine

## 2022-12-12 ENCOUNTER — Other Ambulatory Visit: Payer: Self-pay

## 2022-12-12 DIAGNOSIS — M79602 Pain in left arm: Secondary | ICD-10-CM | POA: Diagnosis not present

## 2022-12-12 DIAGNOSIS — R0789 Other chest pain: Secondary | ICD-10-CM

## 2022-12-12 DIAGNOSIS — J9801 Acute bronchospasm: Secondary | ICD-10-CM | POA: Diagnosis not present

## 2022-12-12 DIAGNOSIS — R0602 Shortness of breath: Secondary | ICD-10-CM

## 2022-12-12 MED ORDER — PREDNISONE 20 MG PO TABS: 40.0000 mg | ORAL_TABLET | Freq: Every day | ORAL | 0 refills | Status: AC

## 2022-12-12 MED ORDER — KETOROLAC TROMETHAMINE 30 MG/ML IJ SOLN
INTRAMUSCULAR | Status: AC
  Filled 2022-12-12: qty 1

## 2022-12-12 MED ORDER — TIZANIDINE HCL 4 MG PO TABS: 4.0000 mg | ORAL_TABLET | Freq: Three times a day (TID) | ORAL | 0 refills | Status: DC | PRN

## 2022-12-12 MED ORDER — KETOROLAC TROMETHAMINE 30 MG/ML IJ SOLN
30.0000 mg | Freq: Once | INTRAMUSCULAR | Status: AC
  Administered 2022-12-12: 30 mg via INTRAMUSCULAR

## 2022-12-12 MED ORDER — TRAMADOL HCL 50 MG PO TABS: 50.0000 mg | ORAL_TABLET | Freq: Four times a day (QID) | ORAL | 0 refills | Status: DC | PRN

## 2022-12-12 MED ORDER — ALBUTEROL SULFATE HFA 108 (90 BASE) MCG/ACT IN AERS: 2.0000 | INHALATION_SPRAY | RESPIRATORY_TRACT | 0 refills | Status: DC | PRN

## 2022-12-12 NOTE — ED Triage Notes (Signed)
Left arm was painful to movement when lowering her mask.  Left radial pulse 2+

## 2022-12-12 NOTE — ED Notes (Signed)
Patient to xray.

## 2022-12-12 NOTE — Discharge Instructions (Signed)
Albuterol inhaler--do 2 puffs every 4 hours as needed for shortness of breath or wheezing  Take prednisone 20 mg--2 daily for 5 days   Take tizanidine 4 mg--1 every 8 hours as needed for muscle spasms; this medication can cause dizziness and sleepiness  Take tramadol 50 mg-- 1 tablet every 6 hours as needed for pain.  This medication can make you sleepy or dizzy  You have been given a shot of Toradol 30 mg today.  Heating pad or warm compress applied to the sore areas could also help.

## 2022-12-12 NOTE — ED Triage Notes (Signed)
Shooting pain in left elbow pain 1-2 weeks ago.  Patient complains of sob and chest pain.  Denies falls.  Chest issues for 3-4 days.  Patient has a cough, runny nose, and center chest pain described as sharp.  Sometimes twisting of body causes pain.    Patient concerned for mold exposure in home.

## 2022-12-12 NOTE — ED Provider Notes (Signed)
[facility]    CSN: [id number] Arrival date & time: 12/12/22  9509      History   Chief Complaint No chief complaint on file.   HPI Lori Mejia is a 47 y.o. female.   HPI Here for sharp pleuritic chest pain, shortness of breath, and left arm pain.  Symptoms have been going on about 3 weeks.  She is also had a little bit of rhinorrhea, but no fever and no cough noted.  She has never been known to have asthma.  She did quit smoking about 3 months ago.  She has discovered mold or mildew in her shower and bath area since the symptoms began.  History reviewed. No pertinent past medical history.  There are no problems to display for this patient.   Past Surgical History:  Procedure Laterality Date   CHOLECYSTECTOMY     TUBAL LIGATION      OB History   No obstetric history on file.      Home Medications    Prior to Admission medications   Medication Sig Start Date End Date Taking? Authorizing Provider  albuterol (VENTOLIN HFA) 108 (90 Base) MCG/ACT inhaler Inhale 2 puffs into the lungs every 4 (four) hours as needed for wheezing or shortness of breath. 12/12/22  Yes Barrett Henle, MD  predniSONE (DELTASONE) 20 MG tablet Take 2 tablets (40 mg total) by mouth daily with breakfast for 5 days. 12/12/22 12/17/22 Yes Eder Macek, Gwenlyn Perking, MD  tiZANidine (ZANAFLEX) 4 MG tablet Take 1 tablet (4 mg total) by mouth every 8 (eight) hours as needed for muscle spasms. 12/12/22  Yes Maryama Kuriakose, Gwenlyn Perking, MD  traMADol (ULTRAM) 50 MG tablet Take 1 tablet (50 mg total) by mouth every 6 (six) hours as needed (pain). 12/12/22  Yes Barrett Henle, MD    Family History History reviewed. No pertinent family history.  Social History Social History   Tobacco Use   Smoking status: Former    Packs/day: 0.50    Types: Cigarettes   Smokeless tobacco: Never  Vaping Use   Vaping Use: Some days  Substance Use Topics   Alcohol use: No   Drug use: No     Allergies   Patient  has no known allergies.   Review of Systems Review of Systems   Physical Exam Triage Vital Signs ED Triage Vitals  Enc Vitals Group     BP 12/12/22 0939 (!) 177/119     Pulse Rate 12/12/22 0939 76     Resp 12/12/22 0939 20     Temp 12/12/22 0939 98.3 F (36.8 C)     Temp Source 12/12/22 0939 Oral     SpO2 12/12/22 0939 96 %     Weight --      Height --      Head Circumference --      Peak Flow --      Pain Score 12/12/22 0936 6     Pain Loc --      Pain Edu? --      Excl. in [place]? --    No data found.  Updated Vital Signs BP (!) 156/102 (BP Location: Right Arm)   Pulse 76   Temp 98.3 F (36.8 C) (Oral)   Resp 20   SpO2 96%   Visual Acuity Right Eye Distance:   Left Eye Distance:   Bilateral Distance:    Right Eye Near:   Left Eye Near:    Bilateral Near:  Physical Exam Vitals reviewed.  Constitutional:      General: She is not in acute distress.    Appearance: She is not ill-appearing, toxic-appearing or diaphoretic.  HENT:     Mouth/Throat:     Mouth: Mucous membranes are moist.  Eyes:     Extraocular Movements: Extraocular movements intact.     Conjunctiva/sclera: Conjunctivae normal.     Pupils: Pupils are equal, round, and reactive to light.  Cardiovascular:     Rate and Rhythm: Normal rate and regular rhythm.     Heart sounds: No murmur heard. Pulmonary:     Effort: No respiratory distress.     Breath sounds: No stridor. No wheezing, rhonchi or rales.  Chest:     Chest wall: Tenderness (upper sternal border bilateral) present.  Musculoskeletal:     Comments: There is pain on range of motion about the left shoulder.  Left trapezius is tender also.  No rash  Skin:    Coloration: Skin is not jaundiced or pale.  Neurological:     General: No focal deficit present.     Mental Status: She is alert and oriented to person, place, and time.  Psychiatric:        Behavior: Behavior normal.      UC Treatments / Results  Labs (all labs ordered  are listed, but only abnormal results are displayed) Labs Reviewed - No data to display  EKG   Radiology DG Chest 2 View  Result Date: 12/12/2022 CLINICAL DATA:  Pleuritic chest pain and shortness of breath. EXAM: CHEST - 2 VIEW COMPARISON:  04/11/2010 FINDINGS: The heart size and mediastinal contours are within normal limits. Both lungs are clear. The visualized skeletal structures are unremarkable. Negative for a pneumothorax. No pleural effusions. IMPRESSION: No active cardiopulmonary disease. Electronically Signed   By: Markus Daft M.D.   On: 12/12/2022 10:27    Procedures Procedures (including critical care time)  Medications Ordered in UC Medications  ketorolac (TORADOL) 30 MG/ML injection 30 mg (has no administration in time range)    Initial Impression / Assessment and Plan / UC Course  I have reviewed the triage vital signs and the nursing notes.  Pertinent labs & imaging results that were available during my care of the patient were reviewed by me and considered in my medical decision making (see chart for details).       EKG is benign with normal sinus rhythm.  There are no ST segment elevations or depressions. Chest x-ray is normal and clear.  Medication is sent in for muscle pain and for inflammation in her chest wall.  With her feeling short of breath and possibly having some wheezing/bronchospasm, and albuterol inhaler is also sent in  Since she is going to be taking 5 days of prednisone, I am not going to send an extra nonsteroidal anti-inflammatories.  Instead tramadol is sent for pain.  She also may take Tylenol instead Final Clinical Impressions(s) / UC Diagnoses   Final diagnoses:  Chest wall pain  Bronchospasm  Left arm pain     Discharge Instructions      Albuterol inhaler--do 2 puffs every 4 hours as needed for shortness of breath or wheezing  Take prednisone 20 mg--2 daily for 5 days   Take tizanidine 4 mg--1 every 8 hours as needed for muscle  spasms; this medication can cause dizziness and sleepiness  Take tramadol 50 mg-- 1 tablet every 6 hours as needed for pain.  This medication can make you sleepy  or dizzy  You have been given a shot of Toradol 30 mg today.  Heating pad or warm compress applied to the sore areas could also help.     ED Prescriptions     Medication Sig Dispense Auth. Provider   albuterol (VENTOLIN HFA) 108 (90 Base) MCG/ACT inhaler Inhale 2 puffs into the lungs every 4 (four) hours as needed for wheezing or shortness of breath. 1 each Zenia Resides, MD   predniSONE (DELTASONE) 20 MG tablet Take 2 tablets (40 mg total) by mouth daily with breakfast for 5 days. 10 tablet Zenia Resides, MD   tiZANidine (ZANAFLEX) 4 MG tablet Take 1 tablet (4 mg total) by mouth every 8 (eight) hours as needed for muscle spasms. 30 tablet [place], Janace Aris, MD   traMADol (ULTRAM) 50 MG tablet Take 1 tablet (50 mg total) by mouth every 6 (six) hours as needed (pain). 12 tablet Chip [place], Janace Aris, MD      I have reviewed the PDMP during this encounter.   Zenia Resides, MD 12/12/22 [phone]

## 2023-03-29 ENCOUNTER — Encounter (HOSPITAL_BASED_OUTPATIENT_CLINIC_OR_DEPARTMENT_OTHER): Payer: Self-pay | Admitting: Orthopedic Surgery

## 2023-03-29 ENCOUNTER — Other Ambulatory Visit: Payer: Self-pay

## 2023-04-05 ENCOUNTER — Encounter (HOSPITAL_BASED_OUTPATIENT_CLINIC_OR_DEPARTMENT_OTHER): Payer: Self-pay | Admitting: Orthopedic Surgery

## 2023-04-05 ENCOUNTER — Ambulatory Visit (HOSPITAL_BASED_OUTPATIENT_CLINIC_OR_DEPARTMENT_OTHER): Payer: BLUE CROSS/BLUE SHIELD | Admitting: Anesthesiology

## 2023-04-05 ENCOUNTER — Encounter (HOSPITAL_BASED_OUTPATIENT_CLINIC_OR_DEPARTMENT_OTHER)
Admission: RE | Disposition: A | Discharge status: Home / Self Care | Payer: Self-pay | Source: Home / Self Care | Attending: Orthopedic Surgery

## 2023-04-05 ENCOUNTER — Ambulatory Visit (HOSPITAL_BASED_OUTPATIENT_CLINIC_OR_DEPARTMENT_OTHER)
Admission: RE | Admit: 2023-04-05 | Discharge: 2023-04-05 | Disposition: A | Discharge status: Home / Self Care | Payer: BLUE CROSS/BLUE SHIELD | Attending: Orthopedic Surgery | Admitting: Orthopedic Surgery

## 2023-04-05 DIAGNOSIS — Z87891 Personal history of nicotine dependence: Secondary | ICD-10-CM | POA: Insufficient documentation

## 2023-04-05 DIAGNOSIS — M7712 Lateral epicondylitis, left elbow: Secondary | ICD-10-CM | POA: Insufficient documentation

## 2023-04-05 DIAGNOSIS — Z01818 Encounter for other preprocedural examination: Secondary | ICD-10-CM

## 2023-04-05 HISTORY — DX: Other specified health status: Z78.9

## 2023-04-05 HISTORY — PX: TENNIS ELBOW RELEASE/NIRSCHEL PROCEDURE: SHX6651

## 2023-04-05 LAB — POCT PREGNANCY, URINE: Preg Test, Ur: NEGATIVE

## 2023-04-05 SURGERY — TENNIS ELBOW RELEASE/NIRSCHEL PROCEDURE
Anesthesia: General | Site: Elbow | Laterality: Left

## 2023-04-05 MED ORDER — ONDANSETRON HCL 4 MG/2ML IJ SOLN
INTRAMUSCULAR | Status: DC | PRN
  Administered 2023-04-05: 4 mg via INTRAVENOUS

## 2023-04-05 MED ORDER — FENTANYL CITRATE (PF) 100 MCG/2ML IJ SOLN: 25.0000 ug | INTRAMUSCULAR | Status: DC | PRN

## 2023-04-05 MED ORDER — LACTATED RINGERS IV SOLN: INTRAVENOUS | Status: DC

## 2023-04-05 MED ORDER — ACETAMINOPHEN 160 MG/5ML PO SOLN: 325.0000 mg | ORAL | Status: DC | PRN

## 2023-04-05 MED ORDER — PROMETHAZINE HCL 25 MG/ML IJ SOLN: 6.2500 mg | INTRAMUSCULAR | Status: DC | PRN

## 2023-04-05 MED ORDER — 0.9 % SODIUM CHLORIDE (POUR BTL) OPTIME
TOPICAL | Status: DC | PRN
  Administered 2023-04-05: 200 mL

## 2023-04-05 MED ORDER — CEFAZOLIN SODIUM-DEXTROSE 2-4 GM/100ML-% IV SOLN
INTRAVENOUS | Status: AC
  Filled 2023-04-05: qty 100

## 2023-04-05 MED ORDER — OXYCODONE HCL 5 MG PO TABS: 5.0000 mg | ORAL_TABLET | Freq: Four times a day (QID) | ORAL | 0 refills | Status: DC | PRN

## 2023-04-05 MED ORDER — EPHEDRINE 5 MG/ML INJ
INTRAVENOUS | Status: AC
  Filled 2023-04-05: qty 5

## 2023-04-05 MED ORDER — ATROPINE SULFATE 0.4 MG/ML IV SOLN
INTRAVENOUS | Status: AC
  Filled 2023-04-05: qty 1

## 2023-04-05 MED ORDER — SCOPOLAMINE 1 MG/3DAYS TD PT72
1.0000 | MEDICATED_PATCH | TRANSDERMAL | Status: DC
  Administered 2023-04-05: 1.5 mg via TRANSDERMAL

## 2023-04-05 MED ORDER — FENTANYL CITRATE (PF) 100 MCG/2ML IJ SOLN
INTRAMUSCULAR | Status: AC
  Filled 2023-04-05: qty 2

## 2023-04-05 MED ORDER — MIDAZOLAM HCL 2 MG/2ML IJ SOLN
INTRAMUSCULAR | Status: AC
  Filled 2023-04-05: qty 2

## 2023-04-05 MED ORDER — DEXAMETHASONE SODIUM PHOSPHATE 4 MG/ML IJ SOLN
INTRAMUSCULAR | Status: DC | PRN
  Administered 2023-04-05: 10 mg via INTRAVENOUS

## 2023-04-05 MED ORDER — LIDOCAINE HCL (CARDIAC) PF 100 MG/5ML IV SOSY
PREFILLED_SYRINGE | INTRAVENOUS | Status: DC | PRN
  Administered 2023-04-05: 40 mg via INTRAVENOUS

## 2023-04-05 MED ORDER — LIDOCAINE 2% (20 MG/ML) 5 ML SYRINGE
INTRAMUSCULAR | Status: AC
  Filled 2023-04-05: qty 5

## 2023-04-05 MED ORDER — CEFAZOLIN SODIUM-DEXTROSE 2-4 GM/100ML-% IV SOLN
2.0000 g | INTRAVENOUS | Status: AC
  Administered 2023-04-05: 2 g via INTRAVENOUS

## 2023-04-05 MED ORDER — ACETAMINOPHEN 10 MG/ML IV SOLN: 1000.0000 mg | Freq: Once | INTRAVENOUS | Status: DC | PRN

## 2023-04-05 MED ORDER — SCOPOLAMINE 1 MG/3DAYS TD PT72
MEDICATED_PATCH | TRANSDERMAL | Status: AC
  Filled 2023-04-05: qty 1

## 2023-04-05 MED ORDER — SUCCINYLCHOLINE CHLORIDE 200 MG/10ML IV SOSY
PREFILLED_SYRINGE | INTRAVENOUS | Status: AC
  Filled 2023-04-05: qty 10

## 2023-04-05 MED ORDER — FENTANYL CITRATE (PF) 100 MCG/2ML IJ SOLN
50.0000 ug | Freq: Once | INTRAMUSCULAR | Status: AC
  Administered 2023-04-05: 50 ug via INTRAVENOUS

## 2023-04-05 MED ORDER — PHENYLEPHRINE 80 MCG/ML (10ML) SYRINGE FOR IV PUSH (FOR BLOOD PRESSURE SUPPORT)
PREFILLED_SYRINGE | INTRAVENOUS | Status: AC
  Filled 2023-04-05: qty 10

## 2023-04-05 MED ORDER — ACETAMINOPHEN 325 MG PO TABS: 325.0000 mg | ORAL_TABLET | ORAL | Status: DC | PRN

## 2023-04-05 MED ORDER — MIDAZOLAM HCL 2 MG/2ML IJ SOLN
2.0000 mg | Freq: Once | INTRAMUSCULAR | Status: AC
  Administered 2023-04-05: 2 mg via INTRAVENOUS

## 2023-04-05 MED ORDER — OXYCODONE HCL 5 MG/5ML PO SOLN: 5.0000 mg | Freq: Once | ORAL | Status: DC | PRN

## 2023-04-05 MED ORDER — ONDANSETRON HCL 4 MG/2ML IJ SOLN
INTRAMUSCULAR | Status: AC
  Filled 2023-04-05: qty 2

## 2023-04-05 MED ORDER — ACETAMINOPHEN 500 MG PO TABS
ORAL_TABLET | ORAL | Status: AC
  Filled 2023-04-05: qty 2

## 2023-04-05 MED ORDER — OXYCODONE HCL 5 MG PO TABS: 5.0000 mg | ORAL_TABLET | Freq: Once | ORAL | Status: DC | PRN

## 2023-04-05 MED ORDER — ACETAMINOPHEN 500 MG PO TABS
1000.0000 mg | ORAL_TABLET | Freq: Once | ORAL | Status: AC
  Administered 2023-04-05: 1000 mg via ORAL

## 2023-04-05 MED ORDER — DEXAMETHASONE SODIUM PHOSPHATE 10 MG/ML IJ SOLN
INTRAMUSCULAR | Status: AC
  Filled 2023-04-05: qty 1

## 2023-04-05 MED ORDER — AMISULPRIDE (ANTIEMETIC) 5 MG/2ML IV SOLN: 10.0000 mg | Freq: Once | INTRAVENOUS | Status: DC | PRN

## 2023-04-05 MED ORDER — PROPOFOL 10 MG/ML IV BOLUS
INTRAVENOUS | Status: DC | PRN
  Administered 2023-04-05: 200 mg via INTRAVENOUS

## 2023-04-05 MED ORDER — ONDANSETRON HCL 4 MG PO TABS: 4.0000 mg | ORAL_TABLET | Freq: Three times a day (TID) | ORAL | 0 refills | Status: AC | PRN

## 2023-04-05 SURGICAL SUPPLY — 54 items
ANCH SUT 2 NDL DX FBRTK (Anchor) ×1 IMPLANT
ANCHOR KNOTLESS SUT DX #2 (Anchor) IMPLANT
APL PRP STRL LF DISP 70% ISPRP (MISCELLANEOUS)
BLADE SURG 15 STRL LF DISP TIS (BLADE) ×1 IMPLANT
BLADE SURG 15 STRL SS (BLADE) ×1
BNDG CMPR 5X4 CHSV STRCH STRL (GAUZE/BANDAGES/DRESSINGS) ×1
BNDG CMPR 5X4 KNIT ELC UNQ LF (GAUZE/BANDAGES/DRESSINGS) ×1
BNDG CMPR 9X4 STRL LF SNTH (GAUZE/BANDAGES/DRESSINGS) ×1
BNDG COHESIVE 4X5 TAN STRL LF (GAUZE/BANDAGES/DRESSINGS) ×1 IMPLANT
BNDG ELASTIC 4INX 5YD STR LF (GAUZE/BANDAGES/DRESSINGS) ×1 IMPLANT
BNDG ESMARK 4X9 LF (GAUZE/BANDAGES/DRESSINGS) ×1 IMPLANT
CHLORAPREP W/TINT 26 (MISCELLANEOUS) ×1 IMPLANT
CLSR STERI-STRIP ANTIMIC 1/2X4 (GAUZE/BANDAGES/DRESSINGS) ×1 IMPLANT
COVER BACK TABLE 60X90IN (DRAPES) IMPLANT
COVER MAYO STAND STRL (DRAPES) IMPLANT
CUFF TOURN SGL QUICK 18X4 (TOURNIQUET CUFF) IMPLANT
CUFF TOURN SGL QUICK 24 (TOURNIQUET CUFF)
CUFF TRNQT CYL 24X4X16.5-23 (TOURNIQUET CUFF) IMPLANT
DRAPE EXTREMITY T 121X128X90 (DISPOSABLE) ×1 IMPLANT
DRAPE IMP U-DRAPE 54X76 (DRAPES) IMPLANT
DRAPE OEC MINIVIEW 54X84 (DRAPES) IMPLANT
DRAPE U-SHAPE 47X51 STRL (DRAPES) ×1 IMPLANT
ELECT REM PT RETURN 9FT ADLT (ELECTROSURGICAL) ×1
ELECTRODE REM PT RTRN 9FT ADLT (ELECTROSURGICAL) ×1 IMPLANT
GAUZE SPONGE 4X4 12PLY STRL (GAUZE/BANDAGES/DRESSINGS) ×1 IMPLANT
GLOVE BIO SURGEON STRL SZ7.5 (GLOVE) ×2 IMPLANT
GLOVE BIOGEL PI IND STRL 8 (GLOVE) ×2 IMPLANT
GOWN STRL REUS W/ TWL LRG LVL3 (GOWN DISPOSABLE) ×1 IMPLANT
GOWN STRL REUS W/TWL LRG LVL3 (GOWN DISPOSABLE) ×1
GOWN STRL REUS W/TWL XL LVL3 (GOWN DISPOSABLE) ×2 IMPLANT
KIT FIBERTAK DX KNTLS DISP (KITS) IMPLANT
NS IRRIG 1000ML POUR BTL (IV SOLUTION) ×1 IMPLANT
PACK BASIN DAY SURGERY FS (CUSTOM PROCEDURE TRAY) ×1 IMPLANT
PAD CAST 4YDX4 CTTN HI CHSV (CAST SUPPLIES) ×1 IMPLANT
PADDING CAST COTTON 4X4 STRL (CAST SUPPLIES)
PENCIL SMOKE EVACUATOR (MISCELLANEOUS) ×1 IMPLANT
SLEEVE SCD COMPRESS KNEE MED (STOCKING) IMPLANT
SLING ARM FOAM STRAP LRG (SOFTGOODS) ×1 IMPLANT
SPIKE FLUID TRANSFER (MISCELLANEOUS) IMPLANT
SPLINT PLASTER CAST FAST 5X30 (CAST SUPPLIES) ×10 IMPLANT
SPLINT WRIST 10 LEFT UNV (SOFTGOODS) IMPLANT
STOCKINETTE 4X48 STRL (DRAPES) ×1 IMPLANT
STOCKINETTE IMPERVIOUS LG (DRAPES) IMPLANT
SUCTION FRAZIER HANDLE 10FR (MISCELLANEOUS)
SUCTION TUBE FRAZIER 10FR DISP (MISCELLANEOUS) IMPLANT
SUT MNCRL AB 3-0 PS2 18 (SUTURE) IMPLANT
SUT MNCRL AB 4-0 PS2 18 (SUTURE) ×1 IMPLANT
SUT VIC AB 0 CT1 27 (SUTURE)
SUT VIC AB 0 CT1 27XBRD ANBCTR (SUTURE) ×1 IMPLANT
SUT VIC AB 3-0 SH 27 (SUTURE) ×1
SUT VIC AB 3-0 SH 27X BRD (SUTURE) IMPLANT
SYR BULB EAR ULCER 3OZ GRN STR (SYRINGE) ×1 IMPLANT
TOWEL GREEN STERILE FF (TOWEL DISPOSABLE) ×1 IMPLANT
YANKAUER SUCT BULB TIP NO VENT (SUCTIONS) ×1 IMPLANT

## 2023-04-05 NOTE — Anesthesia Preprocedure Evaluation (Addendum)
Anesthesia Evaluation  Patient identified by MRN, date of birth, ID band Patient awake    Reviewed: Allergy & Precautions, NPO status , Patient's Chart, lab work & pertinent test results  Airway Mallampati: I  TM Distance: >3 FB Neck ROM: Full    Dental  (+) Teeth Intact, Dental Advisory Given   Pulmonary former smoker   breath sounds clear to auscultation       Cardiovascular negative cardio ROS  Rhythm:Regular Rate:Normal     Neuro/Psych    GI/Hepatic negative GI ROS, Neg liver ROS,,,  Endo/Other  negative endocrine ROS    Renal/GU negative Renal ROS     Musculoskeletal negative musculoskeletal ROS (+)    Abdominal   Peds  Hematology negative hematology ROS (+)   Anesthesia Other Findings   Reproductive/Obstetrics                             Anesthesia Physical Anesthesia Plan  ASA: 2  Anesthesia Plan: General   Post-op Pain Management: Regional block* and Tylenol PO (pre-op)*   Induction: Intravenous  PONV Risk Score and Plan: 4 or greater and Ondansetron, Dexamethasone, Midazolam and Scopolamine patch - Pre-op  Airway Management Planned: LMA  Additional Equipment: None  Intra-op Plan:   Post-operative Plan: Extubation in OR  Informed Consent:   Plan Discussed with: CRNA  Anesthesia Plan Comments:        Anesthesia Quick Evaluation

## 2023-04-05 NOTE — Op Note (Signed)
Date of Surgery: 04/05/2023  INDICATIONS: Ms. Lori Mejia is a 47 y.o.-year-old female with a left recalcitrant lateral epicondylitis;  The patient did consent to the procedure after discussion of the risks and benefits.  PREOPERATIVE DIAGNOSIS:  Left elbow lateral epicondylitis  POSTOPERATIVE DIAGNOSIS: Same.  PROCEDURE:  1.  Left lateral elbow open debridement of extensor carpi radialis brevis with repair of extensor mechanism with suture anchor  SURGEON: Maryan Rued, M.D.  ASSIST: Dion Saucier, PA-C  Assistant attestation:  PA Sharon Seller was present for the entire procedure.  ANESTHESIA:  general, with regional  IV FLUIDS AND URINE: See anesthesia.  ESTIMATED BLOOD LOSS: 10  mL.  IMPLANTS: Arthrex 2.  6 mm knotless fiber tack anchor for repair of ECRB.  DRAINS: None  Tourniquet:  30 minutes left brachium  COMPLICATIONS: None.  DESCRIPTION OF PROCEDURE: The patient was brought to the operating room and placed supine on the operating table.  The patient had been signed prior to the procedure and this was documented. The patient had the anesthesia placed by the anesthesiologist.  A time-out was performed to confirm that this was the correct patient, site, side and location. The patient did receive antibiotics prior to the incision and was re-dosed during the procedure as needed at indicated intervals.  A tourniquet was placed.  The patient had the operative extremity prepped and draped in the standard surgical fashion.     We began the procedure by establishing Kocher's interval overlying the lateral elbow.  We identified the interval between the [facility] and the extensor carpi radialis longus.  We split this longitudinally and dissected down to the ECRB.  We followed this from the radiocapitellar joint up to the lateral epicondyle attachment.  We then sharply excised to the tendinous portion and at the bony attachment of the ECRB with rondure and 15 blade.  We are satisfied with the  debridement.  We then repaired with a watertight closure utilizing a knotless Arthrex 2.6 mm fiber tack anchor at the lateral epicondyle.  This was drilled first for a pilot hole and then placed with malleted into the drill tunnel.  This was able to have excellent seating and fixation.  We then utilized the working suture in a baseball stitch running technique inverted sutures were placed in the defect of the extensor tendon.  We then loaded the suture into the knotless mechanism and the cinched down to bone nicely with a watertight closure.  The continuation of the fascial split was then closed with a 3-0 Vicryl.  The wound was then copiously irrigated with normal saline.  Deep layers were closed with Vicryl and skin closed with running 3-0 subcuticular Monocryl and Steri-Strips.  The wrist was placed in a lacer Velcro splint to maintain neutral position.  Standard sterile dressings were applied to the elbow.  She was awoken from general anesthesia in stable condition.  All counts were correct x 2.  She was placed in a sling until the nerve block resolves.  POSTOPERATIVE PLAN:  Ms. Lori Mejia will be discharged home from PACU today.  She will wear the sling until her nerve block resolves.  She can then go ahead with range of motion at the elbow as tolerated.  We will limit wrist extension and long finger as well as index finger extension to allow for healing of the repair site.  She will begin outpatient occupational therapy in 2 weeks to work on scar mobilization as well as recovery from the extensor repair.  I will see  her back in the office in 2 weeks for wound check.

## 2023-04-05 NOTE — Anesthesia Procedure Notes (Signed)
Anesthesia Regional Block: Supraclavicular block   Pre-Anesthetic Checklist: , timeout performed,  Correct Patient, Correct Site, Correct Laterality,  Correct Procedure, Correct Position, site marked,  Risks and benefits discussed,  Surgical consent,  Pre-op evaluation,  At surgeon's request and post-op pain management  Laterality: Left  Prep: chloraprep       Needles:  Injection technique: Single-shot  Needle Type: Echogenic Stimulator Needle     Needle Length: 9cm  Needle Gauge: 21     Additional Needles:   Procedures:,,,, ultrasound used (permanent image in chart),,    Narrative:  Start time: 04/05/2023 9:05 AM End time: 04/05/2023 9:10 AM Injection made incrementally with aspirations every 5 mL.  Performed by: Personally  Anesthesiologist: Shelton Silvas, MD  Additional Notes: Discussed risks and benefits of the nerve block in detail, including but not limited vascular injury, permanent nerve damage and infection.   Patient tolerated the procedure well. Local anesthetic introduced in an incremental fashion under minimal resistance after negative aspirations. No paresthesias were elicited. After completion of the procedure, no acute issues were identified and patient continued to be monitored by RN.

## 2023-04-05 NOTE — Progress Notes (Signed)
Assisted Dr. Hollis with left, supraclavicular, ultrasound guided block. Side rails up, monitors on throughout procedure. See vital signs in flow sheet. Tolerated Procedure well. 

## 2023-04-05 NOTE — Anesthesia Postprocedure Evaluation (Signed)
Anesthesia Post Note  Patient: Lori Mejia  Procedure(s) Performed: OPEN LATERAL ELBOW DEBRIDEMENT WITH POSSIBLE REPAIR (Left: Elbow)     Patient location during evaluation: PACU Anesthesia Type: General Level of consciousness: awake and alert Pain management: pain level controlled Vital Signs Assessment: post-procedure vital signs reviewed and stable Respiratory status: spontaneous breathing, nonlabored ventilation, respiratory function stable and patient connected to nasal cannula oxygen Cardiovascular status: blood pressure returned to baseline and stable Postop Assessment: no apparent nausea or vomiting Anesthetic complications: no  No notable events documented.  Last Vitals:  Vitals:   04/05/23 1145 04/05/23 1155  BP:  (!) 169/94  Pulse: 63 (!) 54  Resp: 15 16  Temp:  (!) 36.3 C  SpO2:  94%    Last Pain:  Vitals:   04/05/23 1155  TempSrc:   PainSc: 0-No pain                 Shelton Silvas

## 2023-04-05 NOTE — H&P (Signed)
   ORTHOPAEDIC H and P  REQUESTING PHYSICIAN: Yolonda Kida, MD  PCP:  [facility]  Chief Complaint: Left elbow pain  HPI: Lori Mejia is a 47 y.o. female who complains of worsening left elbow pain despite conservative treatment with injections and outpatient physical therapy as well as bracing.  She is here today for open lateral epicondyle debridement with extensor tendon repair.  No new complaints at this time.  Past Medical History:  Diagnosis Date   Medical history non-contributory    Past Surgical History:  Procedure Laterality Date   CHOLECYSTECTOMY     TUBAL LIGATION     Social History   Socioeconomic History   Marital status: Single    Spouse name: Not on file   Number of children: Not on file   Years of education: Not on file   Highest education level: Not on file  Occupational History   Not on file  Tobacco Use   Smoking status: Former    Packs/day: .5    Types: Cigarettes   Smokeless tobacco: Never  Vaping Use   Vaping Use: Former  Substance and Sexual Activity   Alcohol use: No   Drug use: No   Sexual activity: Not on file  Other Topics Concern   Not on file  Social History Narrative   Not on file   Social Determinants of Health   Financial Resource Strain: Not on file  Food Insecurity: Not on file  Transportation Needs: Not on file  Physical Activity: Not on file  Stress: Not on file  Social Connections: Not on file   History reviewed. No pertinent family history. No Known Allergies Prior to Admission medications   Medication Sig Start Date End Date Taking? Authorizing Provider  albuterol (VENTOLIN HFA) 108 (90 Base) MCG/ACT inhaler Inhale 2 puffs into the lungs every 4 (four) hours as needed for wheezing or shortness of breath. Patient not taking: Reported on 03/29/2023 12/12/22   Zenia Resides, MD  tiZANidine (ZANAFLEX) 4 MG tablet Take 1 tablet (4 mg total) by mouth every 8 (eight) hours as needed for muscle  spasms. Patient not taking: Reported on 03/29/2023 12/12/22   Zenia Resides, MD   No results found.  Positive ROS: All other systems have been reviewed and were otherwise negative with the exception of those mentioned in the HPI and as above.  Physical Exam: General: Alert, no acute distress Cardiovascular: No pedal edema Respiratory: No cyanosis, no use of accessory musculature GI: No organomegaly, abdomen is soft and non-tender Skin: No lesions in the area of chief complaint Neurologic: Sensation intact distally Psychiatric: Patient is competent for consent with normal mood and affect Lymphatic: No axillary or cervical lymphadenopathy  MUSCULOSKELETAL: Left upper extremity is warm and well-perfused with no open wounds or lesions.  She is neurovascularly intact as well.  Assessment: Left elbow recalcitrant lateral epicondylitis  Plan: Plan proceed with open debridement left elbow.  We discussed the risk and benefits of the procedure which include but not limited to bleeding, infection, damage to surrounding nerves and vessels, stiffness of the elbow, recalcitrant pain and symptoms, need for revision surgery as well as the risk of anesthesia.  She has provided informed consent.  Plan to discharge home postoperatively.    Yolonda Kida, MD Cell [phone]    04/05/2023 8:09 AM

## 2023-04-05 NOTE — Transfer of Care (Signed)
Immediate Anesthesia Transfer of Care Note  Patient: Lori Mejia  Procedure(s) Performed: OPEN LATERAL ELBOW DEBRIDEMENT WITH POSSIBLE REPAIR (Left: Elbow)  Patient Location: PACU  Anesthesia Type:GA combined with regional for post-op pain  Level of Consciousness: awake, alert , oriented, drowsy, and patient cooperative  Airway & Oxygen Therapy: Patient Spontanous Breathing and Patient connected to face mask oxygen  Post-op Assessment: Report given to RN and Post -op Vital signs reviewed and stable  Post vital signs: Reviewed and stable  Last Vitals:  Vitals Value Taken Time  BP    Temp    Pulse    Resp    SpO2      Last Pain:  Vitals:   04/05/23 0842  TempSrc: Temporal  PainSc: 6          Complications: No notable events documented.

## 2023-04-05 NOTE — Brief Op Note (Signed)
04/05/2023  10:58 AM  PATIENT:  Lori Mejia  47 y.o. female  PRE-OPERATIVE DIAGNOSIS:  Left lateral epicondylitis  POST-OPERATIVE DIAGNOSIS:  Left lateral epicondylitis  PROCEDURE:  Procedure(s): OPEN LATERAL ELBOW DEBRIDEMENT WITH POSSIBLE REPAIR (Left)  SURGEON:  Surgeon(s) and Role:    * Yolonda Kida, MD - Primary  PHYSICIAN ASSISTANT: Dion Saucier, PA-C   ANESTHESIA:   regional and general  EBL:  10 mL   BLOOD ADMINISTERED:none  DRAINS: none   LOCAL MEDICATIONS USED:  NONE  SPECIMEN:  No Specimen  DISPOSITION OF SPECIMEN:  N/A  COUNTS:  YES  TOURNIQUET:  * Missing tourniquet times found for documented tourniquets in log: [id number] *  DICTATION: .Note written in EPIC  PLAN OF CARE: Discharge to home after PACU  PATIENT DISPOSITION:  PACU - hemodynamically stable.   Delay start of Pharmacological VTE agent (>24hrs) due to surgical blood loss or risk of bleeding: not applicable

## 2023-04-05 NOTE — Anesthesia Procedure Notes (Signed)
Procedure Name: LMA Insertion Date/Time: 04/05/2023 10:17 AM  Performed by: Ronnette Hila, CRNAPre-anesthesia Checklist: Patient identified, Emergency Drugs available, Suction available and Patient being monitored Patient Re-evaluated:Patient Re-evaluated prior to induction Oxygen Delivery Method: Circle system utilized Preoxygenation: Pre-oxygenation with 100% oxygen Induction Type: IV induction Ventilation: Mask ventilation without difficulty LMA: LMA inserted LMA Size: 4.0 Number of attempts: 1 Airway Equipment and Method: Bite block Placement Confirmation: positive ETCO2 Tube secured with: Tape Dental Injury: Teeth and Oropharynx as per pre-operative assessment

## 2023-04-05 NOTE — Discharge Instructions (Addendum)
Orthopedic surgery discharge instructions:  -Maintain postoperative bandages for 3 days.  You may remove these on the third day and begin showering at that time.  Please do not remove the Steri-Strips overlying the incision.  Do not submerge underwater.  -You should wear the wrist brace at all times.  He may sleep in this as well, you can remove it throughout the day for an hour or 2 to do activities of daily living.  Please do not lift anything with the left upper extremity greater than 2 pounds.  -Apply ice to the left elbow for 20 to 30 minutes out of each hour and do this aggressively around-the-clock.  -For mild to moderate pain use Tylenol and Advil in alternating fashion.  For any breakthrough pain use oxycodone as necessary.  -Return to see Dr. Aundria Rud in the office in 2 weeks for routine postoperative check.    May have Tylenol today after 3:00 PM    Post Anesthesia Home Care Instructions  Activity: Get plenty of rest for the remainder of the day. A responsible individual must stay with you for 24 hours following the procedure.  For the next 24 hours, DO NOT: -Drive a car -Advertising copywriter -Drink alcoholic beverages -Take any medication unless instructed by your physician -Make any legal decisions or sign important papers.  Meals: Start with liquid foods such as gelatin or soup. Progress to regular foods as tolerated. Avoid greasy, spicy, heavy foods. If nausea and/or vomiting occur, drink only clear liquids until the nausea and/or vomiting subsides. Call your physician if vomiting continues.  Special Instructions/Symptoms: Your throat may feel dry or sore from the anesthesia or the breathing tube placed in your throat during surgery. If this causes discomfort, gargle with warm salt water. The discomfort should disappear within 24 hours.  If you had a scopolamine patch placed behind your ear for the management of post- operative nausea and/or vomiting:  1. The medication  in the patch is effective for 72 hours, after which it should be removed.  Wrap patch in a tissue and discard in the trash. Wash hands thoroughly with soap and water. 2. You may remove the patch earlier than 72 hours if you experience unpleasant side effects which may include dry mouth, dizziness or visual disturbances. 3. Avoid touching the patch. Wash your hands with soap and water after contact with the patch.    Regional Anesthesia Blocks  1. Numbness or the inability to move the "blocked" extremity may last from 3-48 hours after placement. The length of time depends on the medication injected and your individual response to the medication. If the numbness is not going away after 48 hours, call your surgeon.  2. The extremity that is blocked will need to be protected until the numbness is gone and the  Strength has returned. Because you cannot feel it, you will need to take extra care to avoid injury. Because it may be weak, you may have difficulty moving it or using it. You may not know what position it is in without looking at it while the block is in effect.  3. For blocks in the legs and feet, returning to weight bearing and walking needs to be done carefully. You will need to wait until the numbness is entirely gone and the strength has returned. You should be able to move your leg and foot normally before you try and bear weight or walk. You will need someone to be with you when you first try to ensure  you do not fall and possibly risk injury.  4. Bruising and tenderness at the needle site are common side effects and will resolve in a few days.  5. Persistent numbness or new problems with movement should be communicated to the surgeon or the [facility] [phone] [facility] [phone]).

## 2023-04-08 ENCOUNTER — Encounter (HOSPITAL_BASED_OUTPATIENT_CLINIC_OR_DEPARTMENT_OTHER): Payer: Self-pay | Admitting: Orthopedic Surgery

## 2024-01-06 ENCOUNTER — Ambulatory Visit (INDEPENDENT_AMBULATORY_CARE_PROVIDER_SITE_OTHER): Payer: Medicaid Other

## 2024-01-06 ENCOUNTER — Ambulatory Visit
Admission: RE | Admit: 2024-01-06 | Discharge: 2024-01-06 | Disposition: A | Discharge status: Home / Self Care | Payer: Medicaid Other | Source: Ambulatory Visit | Attending: Nurse Practitioner | Admitting: Nurse Practitioner

## 2024-01-06 VITALS — BP 203/120 | HR 76 | Temp 98.4°F | Resp 20

## 2024-01-06 DIAGNOSIS — M25561 Pain in right knee: Secondary | ICD-10-CM | POA: Diagnosis not present

## 2024-01-06 DIAGNOSIS — G8929 Other chronic pain: Secondary | ICD-10-CM

## 2024-01-06 MED ORDER — KETOROLAC TROMETHAMINE 30 MG/ML IJ SOLN
30.0000 mg | Freq: Once | INTRAMUSCULAR | Status: AC
  Administered 2024-01-06: 30 mg via INTRAMUSCULAR

## 2024-01-06 MED ORDER — NAPROXEN 500 MG PO TABS: 500.0000 mg | ORAL_TABLET | Freq: Two times a day (BID) | ORAL | 0 refills | Status: AC

## 2024-01-06 NOTE — ED Provider Notes (Signed)
[facility]    CSN: [id number] Arrival date & time: 01/06/24  1308      History   Chief Complaint No chief complaint on file.   HPI Lori Mejia is a 48 y.o. female.   Patient presents today with right knee pain and intermittent swelling that has been ongoing for the past couple of months.  She denies any recent trauma, fall, or known injury to her knee.  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  Reports for the past few days, she is felt like her knee is giving way when she walks on it and has noticed more swelling in the knee.  She has applied Voltaren gel without much improvement in the pain and has taken Exer strength Tylenol without much improvement.  No locking, popping, or loud noises coming from the knee.  Patient is aware of elevated blood pressure.  Reports she does not typically have high blood pressure, but when she is in pain she typically does.  She denies chest pain, blurred or double vision, shortness of breath, dizziness/lightheadedness.    Past Medical History:  Diagnosis Date   Medical history non-contributory     There are no active problems to display for this patient.   Past Surgical History:  Procedure Laterality Date   CHOLECYSTECTOMY     TENNIS ELBOW RELEASE/NIRSCHEL PROCEDURE Left 04/05/2023   Procedure: OPEN LATERAL ELBOW DEBRIDEMENT WITH POSSIBLE REPAIR;  Surgeon: Yolonda Kida, MD;  Location: [facility];  Service: Orthopedics;  Laterality: Left;   TUBAL LIGATION      OB History   No obstetric history on file.      Home Medications    Prior to Admission medications   Medication Sig Start Date End Date Taking? Authorizing Provider  naproxen (NAPROSYN) 500 MG tablet Take 1 tablet (500 mg total) by mouth 2 (two) times daily with a meal. 01/06/24  Yes Cathlean Marseilles A, NP  albuterol (VENTOLIN HFA) 108 (90 Base) MCG/ACT inhaler Inhale 2 puffs into the lungs every  4 (four) hours as needed for wheezing or shortness of breath. Patient not taking: Reported on 03/29/2023 12/12/22   Zenia Resides, MD  ondansetron (ZOFRAN) 4 MG tablet Take 1 tablet (4 mg total) by mouth every 8 (eight) hours as needed for nausea. 04/05/23   Yolonda Kida, MD    Family History History reviewed. No pertinent family history.  Social History Social History   Tobacco Use   Smoking status: Former    Current packs/day: 0.50    Types: Cigarettes   Smokeless tobacco: Never  Vaping Use   Vaping status: Former  Substance Use Topics   Alcohol use: No   Drug use: No     Allergies   Patient has no known allergies.   Review of Systems Review of Systems Per HPI  Physical Exam Triage Vital Signs ED Triage Vitals  Encounter Vitals Group     BP 01/06/24 1045 (!) 203/120     Systolic BP Percentile --      Diastolic BP Percentile --      Pulse Rate 01/06/24 1045 76     Resp 01/06/24 1045 20     Temp 01/06/24 1045 98.4 F (36.9 C)     Temp Source 01/06/24 1045 Oral     SpO2 01/06/24 1045 100 %     Weight --      Height --  Head Circumference --      Peak Flow --      Pain Score 01/06/24 1049 8     Pain Loc --      Pain Education --      Exclude from Growth Chart --    No data found.  Updated Vital Signs BP (!) 203/120 (BP Location: Right Arm)   Pulse 76   Temp 98.4 F (36.9 C) (Oral)   Resp 20   SpO2 100%   Visual Acuity Right Eye Distance:   Left Eye Distance:   Bilateral Distance:    Right Eye Near:   Left Eye Near:    Bilateral Near:     Physical Exam Vitals and nursing note reviewed.  Constitutional:      General: She is not in acute distress.    Appearance: Normal appearance. She is not toxic-appearing.  HENT:     Mouth/Throat:     Mouth: Mucous membranes are moist.     Pharynx: Oropharynx is clear.  Pulmonary:     Effort: Pulmonary effort is normal. No respiratory distress.  Musculoskeletal:     Comments: Inspection:  Mild swelling noted to right knee diffusely; no bruising, obvious deformity, redness Palpation: tender to palpation right knee diffusely along lateral and medial joint lines as well as along patellar tendon and inferiorly; no obvious deformities palpated ROM: Difficult to assess range of motion due to patient's pain Strength: Difficult to assess due to pain Neurovascular: neurovascularly intact in bilateral distal lower extremities  Skin:    General: Skin is warm and dry.     Capillary Refill: Capillary refill takes less than 2 seconds.     Coloration: Skin is not jaundiced or pale.     Findings: No erythema.  Neurological:     Mental Status: She is alert and oriented to person, place, and time.  Psychiatric:        Behavior: Behavior is cooperative.      UC Treatments / Results  Labs (all labs ordered are listed, but only abnormal results are displayed) Labs Reviewed - No data to display  EKG   Radiology DG Knee Complete 4 Views Right Result Date: 01/06/2024 CLINICAL DATA:  Right knee pain for 6 months. EXAM: RIGHT KNEE - COMPLETE 4+ VIEW COMPARISON:  None Available. FINDINGS: The mineralization and alignment are normal. There is no evidence of acute fracture or dislocation. The joint spaces are preserved. Minimal spurring in the medial and patellofemoral compartments. Possible small joint effusion. The soft tissues otherwise appear unremarkable. IMPRESSION: No acute osseous findings. Possible small joint effusion. Electronically Signed   By: Carey Bullocks M.D.   On: 01/06/2024 11:47    Procedures Procedures (including critical care time)  Medications Ordered in UC Medications  ketorolac (TORADOL) 30 MG/ML injection 30 mg (30 mg Intramuscular Given 01/06/24 1106)    Initial Impression / Assessment and Plan / UC Course  I have reviewed the triage vital signs and the nursing notes.  Pertinent labs & imaging results that were available during my care of the patient were  reviewed by me and considered in my medical decision making (see chart for details).   Patient is hypertensive in triage, however is asymptomatic.  Vital signs are stable otherwise.  1. Chronic pain of right knee Given length of pain, x-ray imaging obtained; will call patient with abnormal results later today In meantime, will treat pain with Toradol 30 mg IM today in urgent care, after 24 hours, start naproxen alternating  with Tylenol Recommended close follow-up with orthopedic provider for further evaluation and management and contact information provided Work excuse given in the meantime  Update: Contacted patient regarding x-ray results.  No acute bony abnormality of the knee.  There is a little bit of fluid on the knee.  Recommended continue with plan of care as discussed in urgent care today.  Patient verbalized understanding.  All questions answered.  The patient was given the opportunity to ask questions.  All questions answered to their satisfaction.  The patient is in agreement to this plan.    Final Clinical Impressions(s) / UC Diagnoses   Final diagnoses:  Chronic pain of right knee     Discharge Instructions      I will contact you later today with the results of the knee x-ray.  We gave you an injection of Toradol which is a strong anti-inflammatory medication, do not take any NSAIDs for 24 hours.  In the meantime, recommend wearing the knee brace and taking Tylenol 500 to 1000 mg every 6 hours for pain.  Starting tomorrow, you can take the naproxen that have sent to the pharmacy twice daily with food as needed for pain.  Recommend close follow-up with an orthopedic provider; contact information has been attached.    ED Prescriptions     Medication Sig Dispense Auth. Provider   naproxen (NAPROSYN) 500 MG tablet Take 1 tablet (500 mg total) by mouth 2 (two) times daily with a meal. 60 tablet Valentino Nose, NP      I have reviewed the PDMP during this encounter.    Valentino Nose, NP 01/06/24 [phone]

## 2024-01-06 NOTE — ED Triage Notes (Signed)
Pt reports right knee pain and swelling x 1 week, pain shots up into her but and down into her foot, and spasms. Pain has been bothering her for a few months but has gotten worse in the last week.

## 2024-01-06 NOTE — Discharge Instructions (Addendum)
I will contact you later today with the results of the knee x-ray.  We gave you an injection of Toradol which is a strong anti-inflammatory medication, do not take any NSAIDs for 24 hours.  In the meantime, recommend wearing the knee brace and taking Tylenol 500 to 1000 mg every 6 hours for pain.  Starting tomorrow, you can take the naproxen that have sent to the pharmacy twice daily with food as needed for pain.  Recommend close follow-up with an orthopedic provider; contact information has been attached.

## 2024-01-10 ENCOUNTER — Encounter: Payer: Self-pay | Admitting: Orthopedic Surgery

## 2024-01-10 ENCOUNTER — Ambulatory Visit: Payer: Medicaid Other | Admitting: Orthopedic Surgery

## 2024-01-10 VITALS — BP 176/104 | HR 80 | Ht 63.5 in | Wt 214.0 lb

## 2024-01-10 DIAGNOSIS — G8929 Other chronic pain: Secondary | ICD-10-CM

## 2024-01-10 DIAGNOSIS — M25561 Pain in right knee: Secondary | ICD-10-CM

## 2024-01-10 NOTE — Patient Instructions (Signed)
Instructions Following Joint Injections  In clinic today, you received an injection in one of your joints (sometimes more than one).  Occasionally, you can have some pain at the injection site, this is normal.  You can place ice at the injection site, or take over-the-counter medications such as Tylenol (acetaminophen) or Advil (ibuprofen).  Please follow all directions listed on the bottle.  If your joint (knee or shoulder) becomes swollen, red or very painful, please contact the clinic for additional assistance.   Two medications were injected, including lidocaine and a steroid (often referred to as cortisone).  Lidocaine is effective almost immediately but wears off quickly.  However, the steroid can take a few days to improve your symptoms.  In some cases, it can make your pain worse for a couple of days.  Do not be concerned if this happens as it is common.  You can apply ice or take some over-the-counter medications as needed.   Injections in the same joint cannot be repeated for 3 months.  This helps to limit the risk of an infection in the joint.  If you were to develop an infection in your joint, the best treatment option would be surgery.      Knee Exercises  Ask your health care provider which exercises are safe for you. Do exercises exactly as told by your health care provider and adjust them as directed. It is normal to feel mild stretching, pulling, tightness, or discomfort as you do these exercises. Stop right away if you feel sudden pain or your pain gets worse. Do not begin these exercises until told by your health care provider.  Stretching and range-of-motion exercises These exercises warm up your muscles and joints and improve the movement and flexibility of your knee. These exercises also help to relieve pain and swelling.  Knee extension, prone Lie on your abdomen (prone position) on a bed. Place your left / right knee just beyond the edge of the surface so your knee is  not on the bed. You can put a towel under your left / right thigh just above your kneecap for comfort. Relax your leg muscles and allow gravity to straighten your knee (extension). You should feel a stretch behind your left / right knee. Hold this position for 10 seconds. Scoot up so your knee is supported between repetitions. Repeat 10 times. Complete this exercise 3-4 times per week.     Knee flexion, active Lie on your back with both legs straight. If this causes back discomfort, bend your left / right knee so your foot is flat on the floor. Slowly slide your left / right heel back toward your buttocks. Stop when you feel a gentle stretch in the front of your knee or thigh (flexion). Hold this position for 10 seconds. Slowly slide your left / right heel back to the starting position. Repeat 10 times. Complete this exercise 3-4 times per week.      Quadriceps stretch, prone Lie on your abdomen on a firm surface, such as a bed or padded floor. Bend your left / right knee and hold your ankle. If you cannot reach your ankle or pant leg, loop a belt around your foot and grab the belt instead. Gently pull your heel toward your buttocks. Your knee should not slide out to the side. You should feel a stretch in the front of your thigh and knee (quadriceps). Hold this position for 10 seconds. Repeat 10 times. Complete this exercise 3-4 times per week.  Hamstring, supine Lie on your back (supine position). Loop a belt or towel over the ball of your left / right foot. The ball of your foot is on the walking surface, right under your toes. Straighten your left / right knee and slowly pull on the belt to raise your leg until you feel a gentle stretch behind your knee (hamstring). Do not let your knee bend while you do this. Keep your other leg flat on the floor. Hold this position for 10 seconds. Repeat 10 times. Complete this exercise 3-4 times per week.   Strengthening exercises These  exercises build strength and endurance in your knee. Endurance is the ability to use your muscles for a long time, even after they get tired.  Quadriceps, isometric This exercise stretches the muscles in front of your thigh (quadriceps) without moving your knee joint (isometric). Lie on your back with your left / right leg extended and your other knee bent. Put a rolled towel or small pillow under your knee if told by your health care provider. Slowly tense the muscles in the front of your left / right thigh. You should see your kneecap slide up toward your hip or see increased dimpling just above the knee. This motion will push the back of the knee toward the floor. For 10 seconds, hold the muscle as tight as you can without increasing your pain. Relax the muscles slowly and completely. Repeat 10 times. Complete this exercise 3-4 times per week. .     Straight leg raises This exercise stretches the muscles in front of your thigh (quadriceps) and the muscles that move your hips (hip flexors). Lie on your back with your left / right leg extended and your other knee bent. Tense the muscles in the front of your left / right thigh. You should see your kneecap slide up or see increased dimpling just above the knee. Your thigh may even shake a bit. Keep these muscles tight as you raise your leg 4-6 inches (10-15 cm) off the floor. Do not let your knee bend. Hold this position for 10 seconds. Keep these muscles tense as you lower your leg. Relax your muscles slowly and completely after each repetition. Repeat 10 times. Complete this exercise 3-4 times per week.  Hamstring, isometric Lie on your back on a firm surface. Bend your left / right knee about 30 degrees. Dig your left / right heel into the surface as if you are trying to pull it toward your buttocks. Tighten the muscles in the back of your thighs (hamstring) to "dig" as hard as you can without increasing any pain. Hold this position for 10  seconds. Release the tension gradually and allow your muscles to relax completely for __________ seconds after each repetition. Repeat 10 times. Complete this exercise 3-4 times per week.  Hamstring curls If told by your health care provider, do this exercise while wearing ankle weights. Begin with 5 lb weights. Then increase the weight by 1 lb (0.5 kg) increments. You can also use an exercise band Lie on your abdomen with your legs straight. Bend your left / right knee as far as you can without feeling pain. Keep your hips flat against the floor. Hold this position for 10 seconds. Slowly lower your leg to the starting position. Repeat 10 times. Complete this exercise 3-4 times per week.      Squats This exercise strengthens the muscles in front of your thigh and knee (quadriceps). Stand in front of a table,  with your feet and knees pointing straight ahead. You may rest your hands on the table for balance but not for support. Slowly bend your knees and lower your hips like you are going to sit in a chair. Keep your weight over your heels, not over your toes. Keep your lower legs upright so they are parallel with the table legs. Do not let your hips go lower than your knees. Do not bend lower than told by your health care provider. If your knee pain increases, do not bend as low. Hold the squat position for 10 seconds. Slowly push with your legs to return to standing. Do not use your hands to pull yourself to standing. Repeat 10 times. Complete this exercise 3-4 times per week .     Wall slides This exercise strengthens the muscles in front of your thigh and knee (quadriceps). Lean your back against a smooth wall or door, and walk your feet out 18-24 inches (46-61 cm) from it. Place your feet hip-width apart. Slowly slide down the wall or door until your knees bend 90 degrees. Keep your knees over your heels, not over your toes. Keep your knees in line with your hips. Hold this  position for 10 seconds. Repeat 10 times. Complete this exercise 3-4 times per week.      Straight leg raises This exercise strengthens the muscles that rotate the leg at the hip and move it away from your body (hip abductors). Lie on your side with your left / right leg in the top position. Lie so your head, shoulder, knee, and hip line up. You may bend your bottom knee to help you keep your balance. Roll your hips slightly forward so your hips are stacked directly over each other and your left / right knee is facing forward. Leading with your heel, lift your top leg 4-6 inches (10-15 cm). You should feel the muscles in your outer hip lifting. Do not let your foot drift forward. Do not let your knee roll toward the ceiling. Hold this position for 10 seconds. Slowly return your leg to the starting position. Let your muscles relax completely after each repetition. Repeat 10 times. Complete this exercise 3-4 times per week.      Straight leg raises This exercise stretches the muscles that move your hips away from the front of the pelvis (hip extensors). Lie on your abdomen on a firm surface. You can put a pillow under your hips if that is more comfortable. Tense the muscles in your buttocks and lift your left / right leg about 4-6 inches (10-15 cm). Keep your knee straight as you lift your leg. Hold this position for 10 seconds. Slowly lower your leg to the starting position. Let your leg relax completely after each repetition. Repeat 10 times. Complete this exercise 3-4 times per week.

## 2024-01-10 NOTE — Progress Notes (Addendum)
 New Patient Visit  Assessment: Lori Mejia is a 48 y.o. female with the following: 1. Chronic pain of right knee  Plan: Lori Mejia has pain in her right knee.  It has been ongoing for several months.  She specifically denies a recent injury.  She has had a recent worsening.  No buckling.  No falls.  No history of remote injury.  X-rays are normal, with a small intra-articular effusion.  We discussed proceeding with an injection, and this was completed in clinic today.  Continue to use a brace.  Ibuprofen  or naproxen  as needed.  Follow-up in 1 month.  Procedure note injection Right knee joint   Verbal consent was obtained to inject the right knee joint  Timeout was completed to confirm the site of injection.  The skin was prepped with alcohol and ethyl chloride was sprayed at the injection site.  A 21-gauge needle was used to inject 40 mg of Depo-Medrol and 1% lidocaine  (4 cc) into the right knee using an anterolateral approach.  There were no complications. A sterile bandage was applied.   Follow-up: Return in about 4 weeks (around 02/07/2024).  Subjective:  Chief Complaint  Patient presents with   Knee Pain    R with swelling and pain for a few months. No injuries very painful on steps.     History of Present Illness: Lori Mejia is a 48 y.o. female who presents for evaluation of right knee pain.  She states that she has had issues in the right knee for several months.  No specific injury.  No twist.  No fall.  No history of an injury to the right knee.  She feels as though she has some swelling.  A few days ago, she presented to an urgent care, as the pain was getting worse.  She has tried ibuprofen  and more recently naproxen .  She is wearing a brace.  She continues to work.   Review of Systems: No fevers or chills No numbness or tingling No chest pain No shortness of breath No bowel or bladder dysfunction No GI distress No headaches   Medical History:  Past Medical  History:  Diagnosis Date   Medical history non-contributory     Past Surgical History:  Procedure Laterality Date   CHOLECYSTECTOMY     TENNIS ELBOW RELEASE/NIRSCHEL PROCEDURE Left 04/05/2023   Procedure: OPEN LATERAL ELBOW DEBRIDEMENT WITH POSSIBLE REPAIR;  Surgeon: Sharl Selinda Dover, MD;  Location: [facility];  Service: Orthopedics;  Laterality: Left;   TUBAL LIGATION      No family history on file. Social History   Tobacco Use   Smoking status: Former    Current packs/day: 0.50    Types: Cigarettes   Smokeless tobacco: Never  Vaping Use   Vaping status: Former  Substance Use Topics   Alcohol use: No   Drug use: No    No Known Allergies  Current Meds  Medication Sig   naproxen  (NAPROSYN ) 500 MG tablet Take 1 tablet (500 mg total) by mouth 2 (two) times daily with a meal.   ondansetron  (ZOFRAN ) 4 MG tablet Take 1 tablet (4 mg total) by mouth every 8 (eight) hours as needed for nausea.    Objective: BP (!) 176/104   Pulse 80   Ht 5' 3.5 (1.613 m)   Wt 214 lb (97.1 kg)   BMI 37.31 kg/m   Physical Exam:  General: Alert and oriented. and No acute distress. Gait: Right sided antalgic gait.  Right knee with a mild effusion.  No bruising.  Tenderness to palpation over the medial joint line.  More tenderness to palpation over the lateral joint line.  She can achieve full extension.  She has pain with flexion beyond 90 degrees.  No increased laxity varus valgus stress.  Negative Lachman.  IMAGING: I personally reviewed images previously obtained from the ED  X-rays were previously obtained at the urgent care center.  No acute injuries.  Mild intra-articular joint effusion.  Well-maintained joint spaces.  No additional signs of arthritis   New Medications:  No orders of the defined types were placed in this encounter.     Oneil DELENA Horde, MD  01/10/2024 9:49 AM

## 2024-02-11 ENCOUNTER — Encounter: Payer: Self-pay | Admitting: Orthopedic Surgery

## 2024-02-11 ENCOUNTER — Ambulatory Visit (INDEPENDENT_AMBULATORY_CARE_PROVIDER_SITE_OTHER): Payer: Medicaid Other | Admitting: Orthopedic Surgery

## 2024-02-11 VITALS — BP 176/135 | HR 73 | Ht 63.5 in | Wt 213.2 lb

## 2024-02-11 DIAGNOSIS — M25561 Pain in right knee: Secondary | ICD-10-CM | POA: Diagnosis not present

## 2024-02-11 DIAGNOSIS — M25361 Other instability, right knee: Secondary | ICD-10-CM | POA: Diagnosis not present

## 2024-02-11 DIAGNOSIS — G8929 Other chronic pain: Secondary | ICD-10-CM

## 2024-02-11 NOTE — Addendum Note (Signed)
 Addended by: Baird Kay on: 02/11/2024 10:16 AM   Modules accepted: Orders

## 2024-02-11 NOTE — Progress Notes (Signed)
 Return Patient Visit  Assessment: Lori Mejia is a 48 y.o. female with the following: 1. Chronic pain of right knee 2. Right knee instability  Plan: Lori Mejia continues to have pain in her right knee.  She has had pain and swelling for the past 3 months.  She notes buckling sensations.  She has pain over the lateral aspect of the right knee, as well as pain with hyperflexion.  Knee is otherwise stable.  Injection was not effective.  Medications have not been helpful.  She continues to wear a brace.  She has tried home exercises.  This pain is limiting her function.  She is having difficulty at work.  As such, I am recommending an MRI.  Once the MRI is completed, she will return to clinic for repeat evaluation.   Follow-up: Return for After MRI.  Subjective:  Chief Complaint  Patient presents with   Knee Pain    R/ hurting and swollen.    History of Present Illness: Lori Mejia is a 48 y.o. female who returns for evaluation of right knee pain.  I saw her in clinic approximate 1 month ago.  At that time, she was complaining of right knee pain.  The injection improved symptoms for a couple of days.  However, she has repeat buckling events.  These are followed by swelling.  She notes some catching in the lateral aspect of the right knee.  She also has pain in the posterior aspect of the right knee.  Bracing is not helping.  Medications are not helping.  Review of Systems: No fevers or chills No numbness or tingling No chest pain No shortness of breath No bowel or bladder dysfunction No GI distress No headaches    Objective: BP (!) 176/135   Pulse 73   Ht 5' 3.5" (1.613 m)   Wt 213 lb 4 oz (96.7 kg)   BMI 37.18 kg/m   Physical Exam:  General: Alert and oriented. and No acute distress. Gait: Right sided antalgic gait.  Right knee with a mild effusion.  There is no bruising.  Tenderness over the medial joint line.  Increased tenderness over the lateral joint line, as  well as the lateral tibial plateau.  Pain with hyperflexion.  Positive McMurray's.  Negative Lachman.  No increased laxity to varus or valgus stress.  IMAGING: No new imaging obtained today     New Medications:  No orders of the defined types were placed in this encounter.     Oliver Barre, MD  02/11/2024 10:07 AM

## 2024-02-14 ENCOUNTER — Ambulatory Visit (HOSPITAL_COMMUNITY)
Admission: RE | Admit: 2024-02-14 | Discharge: 2024-02-14 | Disposition: A | Discharge status: Home / Self Care | Source: Ambulatory Visit | Attending: Orthopedic Surgery | Admitting: Orthopedic Surgery

## 2024-02-14 DIAGNOSIS — M25361 Other instability, right knee: Secondary | ICD-10-CM | POA: Insufficient documentation

## 2024-02-14 DIAGNOSIS — M25561 Pain in right knee: Secondary | ICD-10-CM | POA: Diagnosis present

## 2024-02-14 DIAGNOSIS — G8929 Other chronic pain: Secondary | ICD-10-CM | POA: Diagnosis present

## 2024-03-04 ENCOUNTER — Encounter: Payer: Self-pay | Admitting: Orthopedic Surgery

## 2024-03-04 ENCOUNTER — Ambulatory Visit (INDEPENDENT_AMBULATORY_CARE_PROVIDER_SITE_OTHER): Admitting: Orthopedic Surgery

## 2024-03-04 VITALS — BP 175/113 | HR 69

## 2024-03-04 DIAGNOSIS — G8929 Other chronic pain: Secondary | ICD-10-CM

## 2024-03-04 DIAGNOSIS — M25361 Other instability, right knee: Secondary | ICD-10-CM | POA: Diagnosis not present

## 2024-03-04 DIAGNOSIS — M25561 Pain in right knee: Secondary | ICD-10-CM

## 2024-03-04 NOTE — Progress Notes (Signed)
 Return Patient Visit  Assessment: Lori Mejia is a 48 y.o. female with the following: 1. Chronic pain of right knee   Plan: BRITTAY MOGLE continues to have pain in her right knee.  She describes an aching sensation.  Pain gets worse at night.  Medications and a brace have not been effective.  We reviewed the MRI in clinic today, which demonstrates some mild degenerative changes overall.  Mild degeneration of the anterior horn of the lateral meniscus.  No surgical intervention is warranted.  We discussed proceeding with another injection, as well as formal physical therapy.  The injection was completed in clinic today.  We have placed a referral for therapy.  Procedure note injection Right knee joint   Verbal consent was obtained to inject the right knee joint  Timeout was completed to confirm the site of injection.  The skin was prepped with alcohol and ethyl chloride was sprayed at the injection site.  A 21-gauge needle was used to inject 40 mg of Depo-Medrol and 1% lidocaine (4 cc) into the right knee using an anterolateral approach.  There were no complications. A sterile bandage was applied.    Follow-up: Return in about 2 months (around 05/04/2024).  Subjective:  Chief Complaint  Patient presents with   Results    MRI -Right Knee    History of Present Illness: SHAWNTAVIA SAUNDERS is a 48 y.o. female who returns for evaluation of right knee pain.  I have seen her clinic several times for her right knee.  No specific injury.  She continues to have pain over the lateral knee, as well as the posterior knee.  She is wearing a brace.  Medications are not controlling her symptoms.  Pain gets worse at night.  She states that she is on her feet for extended period of time at work, walking, including stairs.  She has obtained an MRI, and is here discuss the findings.   Review of Systems: No fevers or chills No numbness or tingling No chest pain No shortness of breath No bowel or bladder  dysfunction No GI distress No headaches    Objective: BP (!) 175/113   Pulse 69   Physical Exam:  General: Alert and oriented. and No acute distress. Gait: Right sided antalgic gait.  Right knee with a mild effusion.  There is no bruising.  Tenderness over the medial joint line.  Increased tenderness over the lateral joint line, as well as the lateral tibial plateau.  Pain with hyperflexion.  Positive McMurray's.  Negative Lachman.  No increased laxity to varus or valgus stress.  IMAGING: I personally ordered and reviewed the following images  Right knee MRI  IMPRESSION: 1. 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 2. Mild partial-thickness cartilage loss of the weight-bearing lateral femoral condyle and 5 x 9 mm focal high-grade partial-thickness cartilage loss of the lateral tibial plateau. 3. Partial-thickness cartilage loss of the trochlear groove.   New Medications:  No orders of the defined types were placed in this encounter.     Oliver Barre, MD  03/04/2024 4:06 PM

## 2024-03-04 NOTE — Patient Instructions (Signed)

## 2024-03-31 ENCOUNTER — Telehealth (HOSPITAL_COMMUNITY): Payer: Self-pay

## 2024-03-31 ENCOUNTER — Ambulatory Visit (HOSPITAL_COMMUNITY): Attending: Orthopedic Surgery

## 2024-03-31 NOTE — Therapy (Deleted)
 OUTPATIENT PHYSICAL THERAPY LOWER EXTREMITY EVALUATION   Patient Name: Lori Mejia MRN: [id number] DOB:Jan 15, 1976, 48 y.o., female Today's Date: 03/31/2024  END OF SESSION:   Past Medical History:  Diagnosis Date   Medical history non-contributory    Past Surgical History:  Procedure Laterality Date   CHOLECYSTECTOMY     TENNIS ELBOW RELEASE/NIRSCHEL PROCEDURE Left 04/05/2023   Procedure: OPEN LATERAL ELBOW DEBRIDEMENT WITH POSSIBLE REPAIR;  Surgeon: Janeth Medicus, MD;  Location: [facility];  Service: Orthopedics;  Laterality: Left;   TUBAL LIGATION     There are no active problems to display for this patient.   PCP: [facility]   REFERRING PROVIDER: Tonita Frater, MD  REFERRING DIAG: M25.361 (ICD-10-CM) - Knee instability, right M25.561,G89.29 (ICD-10-CM) - Chronic pain of right knee  THERAPY DIAG:  No diagnosis found.  Rationale for Evaluation and Treatment: Rehabilitation  ONSET DATE: ***  SUBJECTIVE:   SUBJECTIVE STATEMENT: ***  PERTINENT HISTORY: *** PAIN:  Are you having pain? {OPRCPAIN:27236}  PRECAUTIONS: {Therapy precautions:24002}  RED FLAGS: {PT Red Flags:29287}   WEIGHT BEARING RESTRICTIONS: {Yes ***/No:24003}  FALLS:  Has patient fallen in last 6 months? {fallsyesno:27318}  LIVING ENVIRONMENT: Lives with: {OPRC lives with:25569::"lives with their family"} Lives in: {Lives in:25570} Stairs: {opstairs:27293} Has following equipment at home: {Assistive devices:23999}  OCCUPATION: ***  PLOF: {PLOF:24004}  PATIENT GOALS: ***  NEXT MD VISIT: ***  OBJECTIVE:  Note: Objective measures were completed at Evaluation unless otherwise noted.  DIAGNOSTIC FINDINGS: IMPRESSION: 1. 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 2. Mild partial-thickness cartilage loss of the weight-bearing lateral femoral condyle  and 5 x 9 mm focal high-grade partial-thickness cartilage loss of the lateral tibial plateau. 3. Partial-thickness cartilage loss of the trochlear groove.  PATIENT SURVEYS:  LEFS ***  COGNITION: Overall cognitive status: {cognition:24006}     SENSATION: {sensation:27233}  EDEMA:  {edema:24020}  MUSCLE LENGTH: Hamstrings: Right *** deg; Left *** deg Andy Bannister test: Right *** deg; Left *** deg  POSTURE: {posture:25561}  PALPATION: ***  LOWER EXTREMITY ROM:  {AROM/PROM:27142} ROM Right eval Left eval  Hip flexion    Hip extension    Hip abduction    Hip adduction    Hip internal rotation    Hip external rotation    Knee flexion    Knee extension    Ankle dorsiflexion    Ankle plantarflexion    Ankle inversion    Ankle eversion     (Blank rows = not tested)  LOWER EXTREMITY MMT:  MMT Right eval Left eval  Hip flexion    Hip extension    Hip abduction    Hip adduction    Hip internal rotation    Hip external rotation    Knee flexion    Knee extension    Ankle dorsiflexion    Ankle plantarflexion    Ankle inversion    Ankle eversion     (Blank rows = not tested)  LOWER EXTREMITY SPECIAL TESTS:  {LEspecialtests:26242}  FUNCTIONAL TESTS:  {Functional tests:24029}  GAIT: Distance walked: *** Assistive device utilized: {Assistive devices:23999} Level of assistance: {Levels of assistance:24026} Comments: ***  TREATMENT DATE:  03/31/24: PT eval and HEP    PATIENT EDUCATION:  Education details: PT evaluation, objective findings, POC, Importance of HEP, Precautions, Clinic policies  Person educated: Patient Education method: Explanation and Demonstration Education comprehension: verbalized understanding and returned demonstration  HOME EXERCISE PROGRAM: ***  ASSESSMENT:  CLINICAL IMPRESSION: Patient is a 48 y.o.  female who was seen today for physical therapy evaluation and treatment for M25.361 (ICD-10-CM) - Knee instability, right M25.561,G89.29 (ICD-10-CM) - Chronic pain of right knee.   OBJECTIVE IMPAIRMENTS: {opptimpairments:25111}.   ACTIVITY LIMITATIONS: {activitylimitations:27494}  PARTICIPATION LIMITATIONS: {participationrestrictions:25113}  PERSONAL FACTORS: {Personal factors:25162} are also affecting patient's functional outcome.   REHAB POTENTIAL: {rehabpotential:25112}  CLINICAL DECISION MAKING: {clinical decision making:25114}  EVALUATION COMPLEXITY: {Evaluation complexity:25115}   GOALS: Goals reviewed with patient? No  SHORT TERM GOALS: Target date: 04/14/24 Patient will be independent with performance of HEP to demonstrate adequate self management of symptoms.  Baseline:  Goal status: INITIAL  2.   Patient will report at least a 25% improvement with function or pain overall since beginning PT. Baseline:  Goal status: INITIAL   LONG TERM GOALS: Target date: 05/12/24  *** Baseline:  Goal status: INITIAL  2.  *** Baseline:  Goal status: INITIAL  3.  *** Baseline:  Goal status: INITIAL  4.  *** Baseline:  Goal status: INITIAL  5.  *** Baseline:  Goal status: INITIAL  6.  *** Baseline:  Goal status: INITIAL   PLAN:  PT FREQUENCY: 1-2x/week  PT DURATION: 6 weeks  PLANNED INTERVENTIONS: 97164- PT Re-evaluation, 97110-Therapeutic exercises, 97530- Therapeutic activity, 97112- Neuromuscular re-education, 97535- Self Care, 16109- Manual therapy, [phone]- Gait training, Patient/Family education, Balance training, Stair training, Joint mobilization, and Moist heat  PLAN FOR NEXT SESSION: ***   Shirel Mallis E Powell-Butler, PT 03/31/2024, 9:27 AM

## 2024-03-31 NOTE — Telephone Encounter (Signed)
 Called patient regarding missed evaluation on 03/31/24. No answer, left message on voicemail with front desk number to reschedule when ready.    10:23 AM, 03/31/24 Lori Mejia, PT, DPT [facility] [place]

## 2024-05-05 ENCOUNTER — Ambulatory Visit: Admitting: Orthopedic Surgery

## 2024-05-07 NOTE — Therapy (Incomplete)
 OUTPATIENT PHYSICAL THERAPY LOWER EXTREMITY EVALUATION   Patient Name: Lori Mejia MRN: [id number] DOB:1976-03-10, 48 y.o., female Today's Date: 05/07/2024  END OF SESSION:   Past Medical History:  Diagnosis Date   Medical history non-contributory    Past Surgical History:  Procedure Laterality Date   CHOLECYSTECTOMY     TENNIS ELBOW RELEASE/NIRSCHEL PROCEDURE Left 04/05/2023   Procedure: OPEN LATERAL ELBOW DEBRIDEMENT WITH POSSIBLE REPAIR;  Surgeon: Janeth Medicus, MD;  Location: [facility];  Service: Orthopedics;  Laterality: Left;   TUBAL LIGATION     There are no active problems to display for this patient.   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  Rationale for Evaluation and Treatment: Rehabilitation  ONSET DATE: ***  SUBJECTIVE:   SUBJECTIVE STATEMENT: ***  PERTINENT HISTORY: *** PAIN:  Are you having pain? {OPRCPAIN:27236}  PRECAUTIONS: {Therapy precautions:24002}  RED FLAGS: {PT Red Flags:29287}   WEIGHT BEARING RESTRICTIONS: {Yes ***/No:24003}  FALLS:  Has patient fallen in last 6 months? {fallsyesno:27318}  LIVING ENVIRONMENT: Lives with: {OPRC lives with:25569::"lives with their family"} Lives in: {Lives in:25570} Stairs: {opstairs:27293} Has following equipment at home: {Assistive devices:23999}  OCCUPATION: ***  PLOF: {PLOF:24004}  PATIENT GOALS: ***  NEXT MD VISIT: ***  OBJECTIVE:  Note: Objective measures were completed at Evaluation unless otherwise noted.  DIAGNOSTIC FINDINGS: ***  PATIENT SURVEYS:  LEFS  Extreme difficulty/unable (0), Quite a bit of difficulty (1), Moderate difficulty (2), Little difficulty (3), No difficulty (4) Survey date:    Any of your usual work, housework or school activities   2. Usual hobbies,  recreational or sporting activities   3. Getting into/out of the bath   4. Walking between rooms   5. Putting on socks/shoes   6. Squatting    7. Lifting an object, like a bag of groceries from the floor   8. Performing light activities around your home   9. Performing heavy activities around your home   10. Getting into/out of a car   11. Walking 2 blocks   12. Walking 1 mile   13. Going up/down 10 stairs (1 flight)   14. Standing for 1 hour   15.  sitting for 1 hour   16. Running on even ground   17. Running on uneven ground   18. Making sharp turns while running fast   19. Hopping    20. Rolling over in bed   Score total:  ***     COGNITION: Overall cognitive status: {cognition:24006}     SENSATION: {sensation:27233}  EDEMA:  {edema:24020}  MUSCLE LENGTH: Hamstrings: Right *** deg; Left *** deg Andy Bannister test: Right *** deg; Left *** deg  POSTURE: {posture:25561}  PALPATION: ***  LOWER EXTREMITY ROM:  {AROM/PROM:27142} ROM Right eval Left eval  Hip flexion    Hip extension    Hip abduction    Hip adduction    Hip internal rotation    Hip external rotation    Knee flexion    Knee extension    Ankle dorsiflexion    Ankle plantarflexion    Ankle inversion    Ankle eversion     (Blank rows = not tested)  LOWER EXTREMITY MMT:  MMT Right eval Left eval  Hip flexion    Hip extension    Hip abduction    Hip adduction  Hip internal rotation    Hip external rotation    Knee flexion    Knee extension    Ankle dorsiflexion    Ankle plantarflexion    Ankle inversion    Ankle eversion     (Blank rows = not tested)  LOWER EXTREMITY SPECIAL TESTS:  {LEspecialtests:26242}  FUNCTIONAL TESTS:  {Functional tests:24029}  GAIT: Distance walked: *** Assistive device utilized: {Assistive devices:23999} Level of assistance: {Levels of assistance:24026} Comments: ***                                                                                                                                 TREATMENT DATE: 05/07/24 physical therapy evaluation and HEP instruction    PATIENT EDUCATION:  Education details: Patient educated on exam findings, POC, scope of PT, HEP, and ***. Person educated: Patient Education method: Explanation, Demonstration, and Handouts Education comprehension: verbalized understanding, returned demonstration, verbal cues required, and tactile cues required   HOME EXERCISE PROGRAM: ***  ASSESSMENT:  CLINICAL IMPRESSION: Patient is a 48 y.o. female who was seen today for physical therapy evaluation and treatment for M25.361 (ICD-10-CM) - Knee instability, right M25.561,G89.29 (ICD-10-CM) - Chronic pain of right knee.   OBJECTIVE IMPAIRMENTS: {opptimpairments:25111}.   ACTIVITY LIMITATIONS: {activitylimitations:27494}  PARTICIPATION LIMITATIONS: {participationrestrictions:25113}  PERSONAL FACTORS: {Personal factors:25162} are also affecting patient's functional outcome.   REHAB POTENTIAL: Good  CLINICAL DECISION MAKING: Evolving/moderate complexity  EVALUATION COMPLEXITY: Moderate   GOALS: Goals reviewed with patient? No  SHORT TERM GOALS: Target date: *** *** Baseline: Goal status: INITIAL  2.  *** Baseline:  Goal status: INITIAL  3.  *** Baseline:  Goal status: INITIAL  4.  *** Baseline:  Goal status: INITIAL  5.  *** Baseline:  Goal status: INITIAL  6.  *** Baseline:  Goal status: INITIAL  LONG TERM GOALS: Target date: ***  *** Baseline:  Goal status: INITIAL  2.  *** Baseline:  Goal status: INITIAL  3.  *** Baseline:  Goal status: INITIAL  4.  *** Baseline:  Goal status: INITIAL  5.  *** Baseline:  Goal status: INITIAL  6.  *** Baseline:  Goal status: INITIAL   PLAN:  PT FREQUENCY: {rehab frequency:25116}  PT DURATION: {rehab duration:25117}  PLANNED INTERVENTIONS: 97164- PT Re-evaluation, 97110-Therapeutic exercises, 97530- Therapeutic activity, 97112-  Neuromuscular re-education, 97535- Self Care, 09811- Manual therapy, [id number]- Gait training, [id number]- Orthotic Fit/training, [id number]- Canalith repositioning, [id number]- Aquatic Therapy, 97760- Splinting, 97597- Wound care (first 20 sq cm), 97598- Wound care (each additional 20 sq cm)Patient/Family education, Balance training, Stair training, Taping, Dry Needling, Joint mobilization, Joint manipulation, Spinal manipulation, Spinal mobilization, Scar mobilization, and DME instructions.   PLAN FOR NEXT SESSION: Review HEP and goals;    2:30 PM, 05/07/24 Lori Mejia Lori Mejia MPT [facility] [place] [phone]

## 2024-05-08 ENCOUNTER — Ambulatory Visit (HOSPITAL_COMMUNITY)

## 2024-05-08 ENCOUNTER — Telehealth (HOSPITAL_COMMUNITY): Payer: Self-pay

## 2024-05-08 NOTE — Telephone Encounter (Signed)
 Left message regarding no show for evaluation appointment today and requested call back to reschedule.   10:08 AM, 05/08/24 Haleema Vanderheyden Small Tita Terhaar MPT [facility] [place] [phone]
